# Patient Record
Sex: Female | Born: 1970 | Race: Black or African American | Hispanic: No | Marital: Married | Smoking: Never smoker
Health system: Southern US, Community
[De-identification: ages and names within clinical notes are randomized; demographics above are authoritative.]

## PROBLEM LIST (undated history)

## (undated) DIAGNOSIS — D219 Benign neoplasm of connective and other soft tissue, unspecified: Secondary | ICD-10-CM

## (undated) DIAGNOSIS — E039 Hypothyroidism, unspecified: Secondary | ICD-10-CM

## (undated) DIAGNOSIS — C801 Malignant (primary) neoplasm, unspecified: Secondary | ICD-10-CM

## (undated) DIAGNOSIS — T7840XA Allergy, unspecified, initial encounter: Secondary | ICD-10-CM

## (undated) HISTORY — PX: BREAST LUMPECTOMY: SHX2

## (undated) HISTORY — DX: Allergy, unspecified, initial encounter: T78.40XA

## (undated) HISTORY — DX: Malignant (primary) neoplasm, unspecified: C80.1

## (undated) HISTORY — PX: EYE SURGERY: SHX253

---

## 2000-03-13 ENCOUNTER — Other Ambulatory Visit: Admission: RE | Admit: 2000-03-13 | Discharge: 2000-03-13 | Payer: Self-pay | Admitting: Obstetrics and Gynecology

## 2001-04-15 ENCOUNTER — Other Ambulatory Visit: Admission: RE | Admit: 2001-04-15 | Discharge: 2001-04-15 | Payer: Self-pay | Admitting: Obstetrics and Gynecology

## 2002-01-22 ENCOUNTER — Other Ambulatory Visit: Admission: RE | Admit: 2002-01-22 | Discharge: 2002-01-22 | Payer: Self-pay | Admitting: Obstetrics & Gynecology

## 2002-02-02 ENCOUNTER — Ambulatory Visit (HOSPITAL_COMMUNITY): Admission: RE | Admit: 2002-02-02 | Discharge: 2002-02-02 | Payer: Self-pay | Admitting: Obstetrics & Gynecology

## 2002-02-02 ENCOUNTER — Encounter: Payer: Self-pay | Admitting: Obstetrics & Gynecology

## 2005-11-01 ENCOUNTER — Ambulatory Visit (HOSPITAL_COMMUNITY): Admission: RE | Admit: 2005-11-01 | Discharge: 2005-11-01 | Payer: Self-pay

## 2009-11-11 ENCOUNTER — Emergency Department (HOSPITAL_COMMUNITY): Admission: EM | Admit: 2009-11-11 | Discharge: 2009-11-11 | Payer: Self-pay | Admitting: Emergency Medicine

## 2010-05-26 ENCOUNTER — Emergency Department (HOSPITAL_COMMUNITY): Admission: EM | Admit: 2010-05-26 | Discharge: 2010-05-26 | Payer: Self-pay | Admitting: Emergency Medicine

## 2010-12-06 LAB — CBC
HCT: 37 % (ref 36.0–46.0)
Hemoglobin: 12.7 g/dL (ref 12.0–15.0)
MCH: 31.7 pg (ref 26.0–34.0)
MCHC: 34.4 g/dL (ref 30.0–36.0)
MCV: 92.2 fL (ref 78.0–100.0)
Platelets: 158 10*3/uL (ref 150–400)
RBC: 4.01 MIL/uL (ref 3.87–5.11)
RDW: 13.2 % (ref 11.5–15.5)
WBC: 3.9 10*3/uL — ABNORMAL LOW (ref 4.0–10.5)

## 2010-12-06 LAB — COMPREHENSIVE METABOLIC PANEL
ALT: 19 U/L (ref 0–35)
AST: 27 U/L (ref 0–37)
Albumin: 3.7 g/dL (ref 3.5–5.2)
Alkaline Phosphatase: 37 U/L — ABNORMAL LOW (ref 39–117)
BUN: 10 mg/dL (ref 6–23)
CO2: 20 mEq/L (ref 19–32)
Calcium: 8.9 mg/dL (ref 8.4–10.5)
Chloride: 111 mEq/L (ref 96–112)
Creatinine, Ser: 0.87 mg/dL (ref 0.4–1.2)
GFR calc Af Amer: >60 mL/min (ref >60)
GFR calc non Af Amer: >60 mL/min (ref >60)
Glucose, Bld: 114 mg/dL — ABNORMAL HIGH (ref 70–99)
Potassium: 3.4 mEq/L — ABNORMAL LOW (ref 3.5–5.1)
Sodium: 142 mEq/L (ref 135–145)
Total Bilirubin: 0.6 mg/dL (ref 0.3–1.2)
Total Protein: 7.4 g/dL (ref 6.0–8.3)

## 2010-12-06 LAB — POCT CARDIAC MARKERS
CKMB, poc: <1 ng/mL — ABNORMAL LOW (ref 1.0–8.0)
Myoglobin, poc: 50.4 ng/mL (ref 12–200)
Troponin i, poc: <0.05 ng/mL (ref 0.00–0.09)

## 2010-12-06 LAB — DIFFERENTIAL
Lymphocytes Relative: 30 % (ref 12–46)
Lymphs Abs: 1.2 10*3/uL (ref 0.7–4.0)
Monocytes Relative: 8 % (ref 3–12)
Neutro Abs: 2.4 10*3/uL (ref 1.7–7.7)
Neutrophils Relative %: 62 % (ref 43–77)

## 2010-12-06 LAB — LIPASE, BLOOD: Lipase: 28 U/L (ref 11–59)

## 2010-12-13 LAB — COMPREHENSIVE METABOLIC PANEL
AST: 30 U/L (ref 0–37)
Albumin: 3.9 g/dL (ref 3.5–5.2)
Alkaline Phosphatase: 41 U/L (ref 39–117)
BUN: 10 mg/dL (ref 6–23)
Chloride: 105 mEq/L (ref 96–112)
GFR calc Af Amer: >60 mL/min (ref >60)
Potassium: 3.4 mEq/L — ABNORMAL LOW (ref 3.5–5.1)
Total Bilirubin: 0.7 mg/dL (ref 0.3–1.2)
Total Protein: 7.9 g/dL (ref 6.0–8.3)

## 2010-12-13 LAB — DIFFERENTIAL
Basophils Relative: 0 % (ref 0–1)
Eosinophils Relative: 0 % (ref 0–5)
Lymphocytes Relative: 21 % (ref 12–46)
Monocytes Absolute: 0.4 10*3/uL (ref 0.1–1.0)
Monocytes Relative: 6 % (ref 3–12)
Neutro Abs: 5 10*3/uL (ref 1.7–7.7)

## 2010-12-13 LAB — URINALYSIS, ROUTINE W REFLEX MICROSCOPIC
Bilirubin Urine: NEGATIVE
Glucose, UA: NEGATIVE mg/dL
Specific Gravity, Urine: 1.017 (ref 1.005–1.030)

## 2010-12-13 LAB — CBC
HCT: 39.7 % (ref 36.0–46.0)
Platelets: 200 10*3/uL (ref 150–400)
RDW: 12.8 % (ref 11.5–15.5)
WBC: 6.8 10*3/uL (ref 4.0–10.5)

## 2010-12-13 LAB — URINE MICROSCOPIC-ADD ON

## 2010-12-13 LAB — POCT PREGNANCY, URINE: Preg Test, Ur: NEGATIVE

## 2011-07-14 ENCOUNTER — Emergency Department (HOSPITAL_COMMUNITY)
Admission: EM | Admit: 2011-07-14 | Discharge: 2011-07-14 | Disposition: A | Discharge status: Home / Self Care | Payer: BC Managed Care – PPO | Attending: Emergency Medicine | Admitting: Emergency Medicine

## 2011-07-14 ENCOUNTER — Emergency Department (HOSPITAL_COMMUNITY): Payer: BC Managed Care – PPO

## 2011-07-14 DIAGNOSIS — I10 Essential (primary) hypertension: Secondary | ICD-10-CM | POA: Insufficient documentation

## 2011-07-14 DIAGNOSIS — R1011 Right upper quadrant pain: Secondary | ICD-10-CM | POA: Insufficient documentation

## 2011-07-14 DIAGNOSIS — K8020 Calculus of gallbladder without cholecystitis without obstruction: Secondary | ICD-10-CM | POA: Insufficient documentation

## 2011-07-14 LAB — COMPREHENSIVE METABOLIC PANEL
ALT: 16 U/L (ref 0–35)
AST: 18 U/L (ref 0–37)
Albumin: 3.7 g/dL (ref 3.5–5.2)
Chloride: 102 mEq/L (ref 96–112)
Creatinine, Ser: 0.73 mg/dL (ref 0.50–1.10)
Potassium: 3.3 mEq/L — ABNORMAL LOW (ref 3.5–5.1)
Sodium: 138 mEq/L (ref 135–145)
Total Bilirubin: 0.1 mg/dL — ABNORMAL LOW (ref 0.3–1.2)

## 2011-07-14 LAB — CBC
MCV: 87.6 fL (ref 78.0–100.0)
Platelets: 190 10*3/uL (ref 150–400)
RDW: 12.7 % (ref 11.5–15.5)
WBC: 6.2 10*3/uL (ref 4.0–10.5)

## 2011-07-14 LAB — DIFFERENTIAL
Basophils Absolute: 0 10*3/uL (ref 0.0–0.1)
Basophils Relative: 0 % (ref 0–1)
Eosinophils Absolute: 0 10*3/uL (ref 0.0–0.7)
Eosinophils Relative: 0 % (ref 0–5)
Neutrophils Relative %: 56 % (ref 43–77)

## 2011-11-24 ENCOUNTER — Telehealth: Payer: Self-pay

## 2011-11-24 NOTE — Telephone Encounter (Signed)
Erroneous phone message. Wrong pt.

## 2011-11-24 NOTE — Telephone Encounter (Signed)
Dr Hal Hope   Pt was under the impression she was to receive something for her knee. Please call and Sherilyn Cooter will come by and pick up the information.

## 2011-11-24 NOTE — Telephone Encounter (Signed)
DR Hal Hope WERE YOU GOING TO GIVE SOMETHING FOR PT KNEE PAIN?

## 2012-10-06 ENCOUNTER — Other Ambulatory Visit: Payer: Self-pay | Admitting: Obstetrics

## 2012-10-06 DIAGNOSIS — Z1231 Encounter for screening mammogram for malignant neoplasm of breast: Secondary | ICD-10-CM

## 2012-10-07 ENCOUNTER — Other Ambulatory Visit: Payer: Self-pay | Admitting: Obstetrics

## 2012-10-07 DIAGNOSIS — D259 Leiomyoma of uterus, unspecified: Secondary | ICD-10-CM

## 2012-10-13 ENCOUNTER — Other Ambulatory Visit: Payer: Self-pay | Admitting: Obstetrics

## 2012-10-13 DIAGNOSIS — D259 Leiomyoma of uterus, unspecified: Secondary | ICD-10-CM

## 2012-10-14 ENCOUNTER — Ambulatory Visit
Admission: RE | Admit: 2012-10-14 | Discharge: 2012-10-14 | Disposition: A | Discharge status: Home / Self Care | Payer: BC Managed Care – PPO | Source: Ambulatory Visit | Attending: Obstetrics | Admitting: Obstetrics

## 2012-10-14 ENCOUNTER — Other Ambulatory Visit: Payer: Self-pay | Admitting: Interventional Radiology

## 2012-10-14 DIAGNOSIS — D259 Leiomyoma of uterus, unspecified: Secondary | ICD-10-CM

## 2012-10-14 HISTORY — DX: Benign neoplasm of connective and other soft tissue, unspecified: D21.9

## 2012-10-14 HISTORY — DX: Hypothyroidism, unspecified: E03.9

## 2012-10-14 NOTE — Progress Notes (Signed)
LMP:  09/28/2012.  Frequency:  Q 28 day.  Length:  5 days, days 1-3 heavy flow w/ clots.  Changes pads & tampons Q 2 hrs.  Occasional spotting.  Dysmenorrhea.    Bulk Sx:  Bloating; urinary urgency; abdominal & pelvic pressure; rarely, constipation.

## 2012-10-16 ENCOUNTER — Ambulatory Visit (HOSPITAL_COMMUNITY)
Admission: RE | Admit: 2012-10-16 | Discharge: 2012-10-16 | Disposition: A | Discharge status: Home / Self Care | Payer: BC Managed Care – PPO | Source: Ambulatory Visit | Attending: Obstetrics | Admitting: Obstetrics

## 2012-10-16 ENCOUNTER — Ambulatory Visit (HOSPITAL_COMMUNITY): Payer: BC Managed Care – PPO

## 2012-10-16 DIAGNOSIS — Z1231 Encounter for screening mammogram for malignant neoplasm of breast: Secondary | ICD-10-CM | POA: Insufficient documentation

## 2012-10-19 NOTE — Consult Note (Signed)
Interventional Radiology New Patient Consultation  Date: 10/14/12 CC: Symptomatic uterine fibroids Referring Physician: Leonette Most A. Clearance Coots, MD  History of the Present Illness:   Lauren Perkins is a 42 year old G1 P1 African American female with a long history of symptomatic uterine fibroids. She was initially diagnosed in 2003. Her dominant symptom is menorrhagia. She reports very heavy bleeding during her cycles often with passage of large clots. The first few days of her cycle she often wears both super absorbent pads and tampons which she changes every two hours. Her cycles tend to last approximately five days in length the first  three days being very heavy. She describes very rare interperiod bleeding. Her periods are fairly regular occurring every 28 days. Additionally, she endorses some dysmenorrhea symptoms. She feels that her cramping is often quite severe and more so than her friends and family. She experiences some relief with use of over- the-counter ibuprofen. Additional symptoms include mild bulk symptoms of bloating, abdominal and pelvic pressure and rarely urinary urgency and constipation.   She has used the NuvaRing in the past. She has not tried over-the- counter prophylactics, or other hormonal therapies. She denies any prior surgical intervention for her uterine fibroids. She denies symptoms of anemia. No significant fatigue, palpitations or light headedness. Her most recent hemoglobin was within normal limits at 13.5 although this is from October 2012. Her most recent imaging is an ultrasound from February of 2007 which documented multiple uterine fibroids. Importantly, while [name] asserts that she likely will not have any further children she is not 100 percent certain that she is family complete.   Past Medical History:  Hypertension, not currently on any medications.  Hypothyroidism.   Past Surgical History:  Right eye surgery in 1975.   Medications:  Levothyroxine  25 mg once daily.   Allergies: No known drug allergies.   Social History: Currently divorced. She has one child. 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 She denies tobacco, alcohol and recreational drug use.  She occasionally consumes coffee.   Family History: Positive for hypertension and diabetes in her mother.   Review of Systems: Pertinent symptoms discussed in the history above. Otherwise, fourteen point review of systems is negative.   Physical Exam:  Vital Signs: 5'9" tall, 197 pounds.  Blood pressure 148/92.  Heart rate 77 beats per minute.  Respiratory rate 16.  Oxygen saturation 99 percent on room air.  Temperature 98.5.  General: Well nourished, well developed and mildly overweight female in no acute distress.  Cardiac: Regular rate and rhythm. No murmurs, rubs or gallops.  Pulmonary: Lungs are clear to auscultation bilaterally.  Abdomen: Soft, nondistended. No palpable abdominal mass. There is mild tenderness to palpation in the suprapubic region.  Pulses: 2+ bilateral femoral and dorsalis pedis pulses.  Neuro: Alert and oriented x 3, normal affect.   Assessment and Plan:  Lauren Perkins is a 42 year old female with symptomatic uterine fibroids. Her dominant symptoms are menorrhagia and dysmenorrhea with more mild bulk related symptoms. Given her uncertainty over whether she is family complete, I counseled her that myomectomy may be a treatment option if she is truly considering the possibility  of having another child. While many women have successfully become pregnant and had healthy babies following uterine artery embolization, the limited data that we have available suggests that myomectomy may be a better alternative than uterine artery embolization in this setting.   We will proceed with an MRI of the pelvis to  evaluate the size, number and location of her uterine fibroids. After that, I would recommend she return to Dr.  Verdell Carmine office to discuss whether or not she is a suitable candidate for  myomectomy. If she decides that she would like to maintain the option for future children and she is a myomectomy candidate, this may be her best treatment option. If she is not a surgical candidate, or if she decides she is family complete I think she would be an excellent candidate for uterine artery embolization (barring any unexpected findings on her MRI).   SIgned,   Sterling Big, M.D.  Vascular and Interventional Radiologist  [facility]

## 2012-10-20 ENCOUNTER — Other Ambulatory Visit: Payer: BC Managed Care – PPO

## 2012-10-24 ENCOUNTER — Ambulatory Visit
Admission: RE | Admit: 2012-10-24 | Discharge: 2012-10-24 | Disposition: A | Discharge status: Home / Self Care | Payer: BC Managed Care – PPO | Source: Ambulatory Visit | Attending: Obstetrics | Admitting: Obstetrics

## 2012-10-24 DIAGNOSIS — D259 Leiomyoma of uterus, unspecified: Secondary | ICD-10-CM

## 2012-10-24 MED ORDER — GADOBENATE DIMEGLUMINE 529 MG/ML IV SOLN
19.0000 mL | Freq: Once | INTRAVENOUS | Status: AC | PRN
  Administered 2012-10-24: 19 mL via INTRAVENOUS

## 2012-10-27 ENCOUNTER — Telehealth: Payer: Self-pay | Admitting: Emergency Medicine

## 2012-10-27 NOTE — Telephone Encounter (Signed)
CALLED PT TO MAKE HER AWARE THAT DR MCCULLOUGH REVIEWED MRI AND SAID GOOD CANDIDATE FOR [place].  IF SHE IS READY TO MOVE FORWARD W/ PROCEDURE TO CALL ME AND LET ME KNOW AND IF SHE WANTS TO KEEP APPT. FOR NEXT WEEK OR JUST SET UP PROCEDURE.

## 2012-11-05 ENCOUNTER — Other Ambulatory Visit: Payer: BC Managed Care – PPO

## 2012-11-18 ENCOUNTER — Ambulatory Visit
Admission: RE | Admit: 2012-11-18 | Discharge: 2012-11-18 | Disposition: A | Discharge status: Home / Self Care | Payer: BC Managed Care – PPO | Source: Ambulatory Visit | Attending: Interventional Radiology | Admitting: Interventional Radiology

## 2012-11-18 DIAGNOSIS — D259 Leiomyoma of uterus, unspecified: Secondary | ICD-10-CM

## 2012-11-19 ENCOUNTER — Telehealth: Payer: Self-pay | Admitting: Emergency Medicine

## 2012-11-19 NOTE — Telephone Encounter (Signed)
LMOVM FOR PT TO MAKE HER AWARE THAT PROCEDURE DID NOT NEED AUTHO AND TINA AT [facility] -IR WILL CONTACT HER TO SET UP [place].

## 2012-11-23 ENCOUNTER — Other Ambulatory Visit: Payer: Self-pay | Admitting: Interventional Radiology

## 2012-11-23 DIAGNOSIS — D259 Leiomyoma of uterus, unspecified: Secondary | ICD-10-CM

## 2012-12-29 ENCOUNTER — Encounter: Payer: Self-pay | Admitting: Obstetrics

## 2013-02-18 ENCOUNTER — Telehealth (HOSPITAL_COMMUNITY): Payer: Self-pay | Admitting: Radiology

## 2013-02-18 NOTE — Telephone Encounter (Signed)
Left message for patient to call back.  Messages have been left for patient on 11-24-12, 11-26-12, 12-02-12.  On 12-02-12 patient requested for UFE to be scheduled week of 02-08-13 and patient would be checking on out of pocket expenses.  Message left on 01-12-13 to schedule patient.  Patient has not called to schedule procedure.

## 2013-05-25 ENCOUNTER — Encounter: Payer: Self-pay | Admitting: Obstetrics

## 2013-06-01 ENCOUNTER — Encounter: Payer: Self-pay | Admitting: Obstetrics

## 2013-11-17 ENCOUNTER — Encounter: Payer: Self-pay | Admitting: Obstetrics

## 2014-03-17 ENCOUNTER — Other Ambulatory Visit: Payer: Self-pay | Admitting: *Deleted

## 2014-03-17 NOTE — Telephone Encounter (Signed)
Pharmacy has sent a refill request for Nuva Ring.  Refill request will be faxed back to pharmacy as denied per protocol, pt needs appointment.  Pt has not been seen in office since January 2014.  Pt has refill in February 2015 with refills and should have made a AEX appt at that time.

## 2014-03-17 NOTE — Telephone Encounter (Signed)
Pharmacy requesting refill on pt Nuva Ring.  After reviewing chart, pt request is denied per protocol due to need for annual exam.

## 2014-03-22 ENCOUNTER — Other Ambulatory Visit: Payer: Self-pay | Admitting: *Deleted

## 2014-03-22 ENCOUNTER — Telehealth: Payer: Self-pay | Admitting: *Deleted

## 2014-03-22 DIAGNOSIS — Z309 Encounter for contraceptive management, unspecified: Secondary | ICD-10-CM

## 2014-03-22 MED ORDER — ETONOGESTREL-ETHINYL ESTRADIOL 0.12-0.015 MG/24HR VA RING: VAGINAL_RING | VAGINAL | Status: DC

## 2014-03-22 NOTE — Telephone Encounter (Signed)
Patient called for a refill on her Nuva-Ring. Patient states she has already made an appointment for an Annual exam for April 12, 2014.  Patient advised we would send her one refill and that when she came in on the 21st the doctor would refill her prescription. Prescription sent to [facility] on [address].

## 2014-04-12 ENCOUNTER — Ambulatory Visit: Payer: Self-pay | Admitting: Obstetrics

## 2014-04-13 ENCOUNTER — Ambulatory Visit (INDEPENDENT_AMBULATORY_CARE_PROVIDER_SITE_OTHER): Payer: BC Managed Care – PPO | Admitting: Obstetrics

## 2014-04-13 ENCOUNTER — Encounter: Payer: Self-pay | Admitting: Obstetrics

## 2014-04-13 VITALS — BP 140/94 | HR 85 | Temp 98.1°F | Ht 69.0 in | Wt 209.0 lb

## 2014-04-13 DIAGNOSIS — Z01419 Encounter for gynecological examination (general) (routine) without abnormal findings: Secondary | ICD-10-CM

## 2014-04-13 DIAGNOSIS — A6000 Herpesviral infection of urogenital system, unspecified: Secondary | ICD-10-CM

## 2014-04-13 DIAGNOSIS — Z3040 Encounter for surveillance of contraceptives, unspecified: Secondary | ICD-10-CM

## 2014-04-13 MED ORDER — ETONOGESTREL-ETHINYL ESTRADIOL 0.12-0.015 MG/24HR VA RING: VAGINAL_RING | VAGINAL | Status: DC

## 2014-04-13 MED ORDER — VALACYCLOVIR HCL 500 MG PO TABS: 500.0000 mg | ORAL_TABLET | Freq: Two times a day (BID) | ORAL | Status: DC

## 2014-04-13 NOTE — Progress Notes (Signed)
Subjective:     Lauren Perkins is a 43 y.o. female here for a routine exam.  Current complaints: None.    Personal health questionnaire:  Is patient Ashkenazi Jewish, have a family history of breast and/or ovarian cancer: no Is there a family history of uterine cancer diagnosed at age < 29, gastrointestinal cancer, urinary tract cancer, family member who is a Field seismologist syndrome-associated carrier: no Is the patient overweight and hypertensive, family history of diabetes, personal history of gestational diabetes or PCOS: no Is patient over 7, have PCOS,  family history of premature CHD under age 25, diabetes, smoke, have hypertension or peripheral artery disease:  no  The HPI was reviewed and explored in further detail by the provider. Gynecologic History Patient's last menstrual period was 03/17/2014. Contraception: NuvaRing vaginal inserts Last Pap: 2014. Results were: normal Last mammogram: 2014. Results were: normal  Obstetric History OB History  Gravida Para Term Preterm AB SAB TAB Ectopic Multiple Living  1 1 1       1     # Outcome Date GA Lbr Len/2nd Weight Sex Delivery Anes PTL Lv  1 TRM 04/24/92 [redacted]w[redacted]d  8 lb 13 oz (3.997 kg) M SVD None  Y      Past Medical History  Diagnosis Date  . Hypothyroidism   . Fibroids     History reviewed. No pertinent past surgical history.  Current outpatient prescriptions:etonogestrel-ethinyl estradiol (NUVARING) 0.12-0.015 MG/24HR vaginal ring, Insert vaginally and leave in place for 3 consecutive weeks, then remove for 1 week., Disp: 1 each, Rfl: 11;  valACYclovir (VALTREX) 500 MG tablet, Take 1 tablet (500 mg total) by mouth 2 (two) times daily., Disp: 30 tablet, Rfl: prn No Known Allergies  History  Substance Use Topics  . Smoking status: Never Smoker   . Smokeless tobacco: Never Used  . Alcohol Use: No    History reviewed. No pertinent family history.    Review of Systems  Constitutional: negative for fatigue and weight  loss Respiratory: negative for cough and wheezing Cardiovascular: negative for chest pain, fatigue and palpitations Gastrointestinal: negative for abdominal pain and change in bowel habits Musculoskeletal:negative for myalgias Neurological: negative for gait problems and tremors Behavioral/Psych: negative for abusive relationship, depression Endocrine: negative for temperature intolerance   Genitourinary:negative for abnormal menstrual periods, genital lesions, hot flashes, sexual problems and vaginal discharge Integument/breast: negative for breast lump, breast tenderness, nipple discharge and skin lesion(s)    Objective:       BP 140/94  Pulse 85  Temp(Src) 98.1 F (36.7 C)  Ht 5\' 9"  (1.753 m)  Wt 209 lb (94.802 kg)  BMI 30.85 kg/m2  LMP 03/17/2014 General:   alert  Skin:   no rash or abnormalities  Lungs:   clear to auscultation bilaterally  Heart:   regular rate and rhythm, S1, S2 normal, no murmur, click, rub or gallop  Breasts:   normal without suspicious masses, skin or nipple changes or axillary nodes  Abdomen:  normal findings: no organomegaly, soft, non-tender and no hernia  Pelvis:  External genitalia: normal general appearance Urinary system: urethral meatus normal and bladder without fullness, nontender Vaginal: normal without tenderness, induration or masses Cervix: normal appearance Adnexa: normal bimanual exam Uterus: anteverted and non-tender, normal size   Lab Review Urine pregnancy test Labs reviewed yes Radiologic studies reviewed no    Assessment:    Healthy female exam.    Plan:    Education reviewed: calcium supplements, low fat, low cholesterol diet, safe sex/STD prevention,  self breast exams and weight bearing exercise. Contraception: NuvaRing vaginal inserts. Follow up in: 1 year.   Meds ordered this encounter  Medications  . etonogestrel-ethinyl estradiol (NUVARING) 0.12-0.015 MG/24HR vaginal ring    Sig: Insert vaginally and leave in  place for 3 consecutive weeks, then remove for 1 week.    Dispense:  1 each    Refill:  11  . valACYclovir (VALTREX) 500 MG tablet    Sig: Take 1 tablet (500 mg total) by mouth 2 (two) times daily.    Dispense:  30 tablet    Refill:  prn    Take for 3 days prn.   Orders Placed This Encounter  Procedures  . WET PREP BY MOLECULAR PROBE

## 2014-04-14 LAB — WET PREP BY MOLECULAR PROBE
Candida species: NEGATIVE
GARDNERELLA VAGINALIS: NEGATIVE
Trichomonas vaginosis: NEGATIVE

## 2014-04-15 LAB — PAP IG AND HPV HIGH-RISK: HPV DNA High Risk: NOT DETECTED

## 2014-05-13 ENCOUNTER — Telehealth: Payer: Self-pay | Admitting: *Deleted

## 2014-05-13 NOTE — Telephone Encounter (Signed)
Patient called stating she had a question about her pap smear results. Patient notified that her pap smear was satisfactory and negative. Patient notified that HPV was not detected. Patient had gotten confused because it said HPV High Rish. Patient notified that that is the way the test is ordered but if she looks over beside it that it says not detected. Patient voiced understanding.

## 2014-07-25 ENCOUNTER — Encounter: Payer: Self-pay | Admitting: Obstetrics

## 2015-02-07 ENCOUNTER — Telehealth: Payer: Self-pay | Admitting: *Deleted

## 2015-02-07 DIAGNOSIS — B3731 Acute candidiasis of vulva and vagina: Secondary | ICD-10-CM

## 2015-02-07 DIAGNOSIS — B373 Candidiasis of vulva and vagina: Secondary | ICD-10-CM

## 2015-02-07 NOTE — Telephone Encounter (Signed)
Patient contacted the office requesting a prescription for Diflucan.  Attempted to contact the patient and left message for patient to call the office.

## 2015-02-09 MED ORDER — FLUCONAZOLE 150 MG PO TABS: 150.0000 mg | ORAL_TABLET | Freq: Every day | ORAL | Status: DC

## 2015-02-09 NOTE — Telephone Encounter (Signed)
Patient is requesting a RF of her Diflucan 10:08 Patient c/o itching with discharge- request RF of Diflucan. Patient understands if her symptoms do not improve- she should call for appointment.

## 2015-03-22 ENCOUNTER — Other Ambulatory Visit: Payer: Self-pay | Admitting: Obstetrics

## 2015-04-18 ENCOUNTER — Encounter: Payer: Self-pay | Admitting: Obstetrics

## 2015-04-18 ENCOUNTER — Ambulatory Visit (INDEPENDENT_AMBULATORY_CARE_PROVIDER_SITE_OTHER): Payer: BC Managed Care – PPO | Admitting: Obstetrics

## 2015-04-18 VITALS — BP 148/97 | HR 85 | Temp 98.9°F | Ht 68.5 in | Wt 211.6 lb

## 2015-04-18 DIAGNOSIS — A6000 Herpesviral infection of urogenital system, unspecified: Secondary | ICD-10-CM | POA: Diagnosis not present

## 2015-04-18 DIAGNOSIS — Z0000 Encounter for general adult medical examination without abnormal findings: Secondary | ICD-10-CM

## 2015-04-18 DIAGNOSIS — Z124 Encounter for screening for malignant neoplasm of cervix: Secondary | ICD-10-CM | POA: Diagnosis not present

## 2015-04-18 DIAGNOSIS — I10 Essential (primary) hypertension: Secondary | ICD-10-CM | POA: Diagnosis not present

## 2015-04-18 DIAGNOSIS — Z01419 Encounter for gynecological examination (general) (routine) without abnormal findings: Secondary | ICD-10-CM | POA: Diagnosis not present

## 2015-04-18 DIAGNOSIS — Z1239 Encounter for other screening for malignant neoplasm of breast: Secondary | ICD-10-CM

## 2015-04-18 MED ORDER — CARVEDILOL 6.25 MG PO TABS: 6.2500 mg | ORAL_TABLET | Freq: Two times a day (BID) | ORAL | Status: DC

## 2015-04-18 MED ORDER — TRIAMTERENE-HCTZ 50-25 MG PO CAPS: 1.0000 | ORAL_CAPSULE | ORAL | Status: DC

## 2015-04-18 MED ORDER — VALACYCLOVIR HCL 500 MG PO TABS
500.0000 mg | ORAL_TABLET | Freq: Two times a day (BID) | ORAL | Status: DC
Start: 2015-04-18 — End: 2015-10-10

## 2015-04-18 NOTE — Progress Notes (Signed)
Subjective:        Lauren Perkins is a 44 y.o. female here for a routine exam.  Current complaints: none.    Personal health questionnaire:  Is patient Ashkenazi Jewish, have a family history of breast and/or ovarian cancer: no Is there a family history of uterine cancer diagnosed at age < 69, gastrointestinal cancer, urinary tract cancer, family member who is a Field seismologist syndrome-associated carrier: no Is the patient overweight and hypertensive, family history of diabetes, personal history of gestational diabetes, preeclampsia or PCOS: no Is patient over 5, have PCOS,  family history of premature CHD under age 19, diabetes, smoke, have hypertension or peripheral artery disease:  no At any time, has a partner hit, kicked or otherwise hurt or frightened you?: no Over the past 2 weeks, have you felt down, depressed or hopeless?: no Over the past 2 weeks, have you felt little interest or pleasure in doing things?:no   Gynecologic History Patient's last menstrual period was 03/25/2015. Contraception: NuvaRing vaginal inserts Last Pap: 2015. Results were: normal Last mammogram: 2014. Results were: normal  Obstetric History OB History  Gravida Para Term Preterm AB SAB TAB Ectopic Multiple Living  1 1 1       1     # Outcome Date GA Lbr Len/2nd Weight Sex Delivery Anes PTL Lv  1 Term 04/24/92 [redacted]w[redacted]d  8 lb 13 oz (3.997 kg) M Vag-Spont None  Y      Past Medical History  Diagnosis Date  . Hypothyroidism   . Fibroids     History reviewed. No pertinent past surgical history.   Current outpatient prescriptions:  .  levothyroxine (SYNTHROID, LEVOTHROID) 25 MCG tablet, Take 25 mcg by mouth daily before breakfast., Disp: , Rfl:  .  NUVARING 0.12-0.015 MG/24HR vaginal ring, insert vaginally AND LEAVE IN PLACE FOR 3 CONSECUTIVE WEEKS,THEN REMOVE FOR 1 WEEK, Disp: 1 each, Rfl: 11 .  valACYclovir (VALTREX) 500 MG tablet, Take 1 tablet (500 mg total) by mouth 2 (two) times daily., Disp: 30  tablet, Rfl: prn .  carvedilol (COREG) 6.25 MG tablet, Take 1 tablet (6.25 mg total) by mouth 2 (two) times daily with a meal., Disp: 60 tablet, Rfl: 11 .  fluconazole (DIFLUCAN) 150 MG tablet, Take 1 tablet (150 mg total) by mouth daily. (Patient not taking: Reported on 04/18/2015), Disp: 1 tablet, Rfl: 0 .  triamterene-hydrochlorothiazide (DYAZIDE) 50-25 MG per capsule, Take 1 capsule by mouth every morning., Disp: 30 capsule, Rfl: 11 No Known Allergies  History  Substance Use Topics  . Smoking status: Never Smoker   . Smokeless tobacco: Never Used  . Alcohol Use: No    History reviewed. No pertinent family history.    Review of Systems  Constitutional: negative for fatigue and weight loss Respiratory: negative for cough and wheezing Cardiovascular: negative for chest pain, fatigue and palpitations Gastrointestinal: negative for abdominal pain and change in bowel habits Musculoskeletal:negative for myalgias Neurological: negative for gait problems and tremors Behavioral/Psych: negative for abusive relationship, depression Endocrine: negative for temperature intolerance   Genitourinary:negative for abnormal menstrual periods, genital lesions, hot flashes, sexual problems and vaginal discharge Integument/breast: negative for breast lump, breast tenderness, nipple discharge and skin lesion(s)    Objective:       BP 148/97 mmHg  Pulse 85  Temp(Src) 98.9 F (37.2 C)  Ht 5' 8.5" (1.74 m)  Wt 211 lb 9.6 oz (95.981 kg)  BMI 31.70 kg/m2  LMP 03/25/2015 General:   alert  Skin:  no rash or abnormalities  Lungs:   clear to auscultation bilaterally  Heart:   regular rate and rhythm, S1, S2 normal, no murmur, click, rub or gallop  Breasts:   normal without suspicious masses, skin or nipple changes or axillary nodes  Abdomen:  normal findings: no organomegaly, soft, non-tender and no hernia  Pelvis:  External genitalia: normal general appearance Urinary system: urethral meatus normal  and bladder without fullness, nontender Vaginal: normal without tenderness, induration or masses Cervix: normal appearance Adnexa: normal bimanual exam Uterus: anteverted and non-tender, normal size   Lab Review Urine pregnancy test Labs reviewed: yes Radiologic studies reviewed:  no  Assessment:    Healthy female exam.    Hypertension   Plan:   Dyazide / Coreg Rx for HTN and referred to Internal Medicine for general health maintenance   Education reviewed: calcium supplements, low fat, low cholesterol diet, safe sex/STD prevention, self breast exams and weight bearing exercise. Contraception: NuvaRing vaginal inserts. Mammogram ordered. Follow up in: 1 year.   Meds ordered this encounter  Medications  . levothyroxine (SYNTHROID, LEVOTHROID) 25 MCG tablet    Sig: Take 25 mcg by mouth daily before breakfast.  . DISCONTD: triamterene-hydrochlorothiazide (DYAZIDE) 50-25 MG per capsule    Sig: Take 1 capsule by mouth every morning.    Dispense:  30 capsule    Refill:  11  . DISCONTD: carvedilol (COREG) 6.25 MG tablet    Sig: Take 1 tablet (6.25 mg total) by mouth 2 (two) times daily with a meal.    Dispense:  60 tablet    Refill:  11  . valACYclovir (VALTREX) 500 MG tablet    Sig: Take 1 tablet (500 mg total) by mouth 2 (two) times daily.    Dispense:  30 tablet    Refill:  prn    Take for 3 days prn.  . carvedilol (COREG) 6.25 MG tablet    Sig: Take 1 tablet (6.25 mg total) by mouth 2 (two) times daily with a meal.    Dispense:  60 tablet    Refill:  11  . triamterene-hydrochlorothiazide (DYAZIDE) 50-25 MG per capsule    Sig: Take 1 capsule by mouth every morning.    Dispense:  30 capsule    Refill:  11   Orders Placed This Encounter  Procedures  . MM Digital Diagnostic Bilat    Standing Status: Future     Number of Occurrences:      Standing Expiration Date: 06/18/2016    Order Specific Question:  Reason for Exam (SYMPTOM  OR DIAGNOSIS REQUIRED)    Answer:   screening    Order Specific Question:  Is the patient pregnant?    Answer:  No    Order Specific Question:  Preferred imaging location?    Answer:  [facility]  . Ambulatory referral to Internal Medicine    Referral Priority:  Routine    Referral Type:  Consultation    Referral Reason:  Specialty Services Required    Requested Specialty:  Internal Medicine    Number of Visits Requested:  1

## 2015-04-18 NOTE — Addendum Note (Signed)
Addended by: Lewie Loron D on: 04/18/2015 11:52 AM   Modules accepted: Orders

## 2015-04-18 NOTE — Patient Instructions (Signed)
Hypertension °Hypertension, commonly called high blood pressure, is when the force of blood pumping through your arteries is too strong. Your arteries are the blood vessels that carry blood from your heart throughout your body. A blood pressure reading consists of a higher number over a lower number, such as 110/72. The higher number (systolic) is the pressure inside your arteries when your heart pumps. The lower number (diastolic) is the pressure inside your arteries when your heart relaxes. Ideally you want your blood pressure below 120/80. °Hypertension forces your heart to work harder to pump blood. Your arteries may become narrow or stiff. Having hypertension puts you at risk for heart disease, stroke, and other problems.  °RISK FACTORS °Some risk factors for high blood pressure are controllable. Others are not.  °Risk factors you cannot control include:  °· Race. You may be at higher risk if you are African American. °· Age. Risk increases with age. °· Gender. Men are at higher risk than women before age 45 years. After age 65, women are at higher risk than men. °Risk factors you can control include: °· Not getting enough exercise or physical activity. °· Being overweight. °· Getting too much fat, sugar, calories, or salt in your diet. °· Drinking too much alcohol. °SIGNS AND SYMPTOMS °Hypertension does not usually cause signs or symptoms. Extremely high blood pressure (hypertensive crisis) may cause headache, anxiety, shortness of breath, and nosebleed. °DIAGNOSIS  °To check if you have hypertension, your health care provider will measure your blood pressure while you are seated, with your arm held at the level of your heart. It should be measured at least twice using the same arm. Certain conditions can cause a difference in blood pressure between your right and left arms. A blood pressure reading that is higher than normal on one occasion does not mean that you need treatment. If one blood pressure reading  is high, ask your health care provider about having it checked again. °TREATMENT  °Treating high blood pressure includes making lifestyle changes and possibly taking medicine. Living a healthy lifestyle can help lower high blood pressure. You may need to change some of your habits. °Lifestyle changes may include: °· Following the DASH diet. This diet is high in fruits, vegetables, and whole grains. It is low in salt, red meat, and added sugars. °· Getting at least 2½ hours of brisk physical activity every week. °· Losing weight if necessary. °· Not smoking. °· Limiting alcoholic beverages. °· Learning ways to reduce stress. ° If lifestyle changes are not enough to get your blood pressure under control, your health care provider may prescribe medicine. You may need to take more than one. Work closely with your health care provider to understand the risks and benefits. °HOME CARE INSTRUCTIONS °· Have your blood pressure rechecked as directed by your health care provider.   °· Take medicines only as directed by your health care provider. Follow the directions carefully. Blood pressure medicines must be taken as prescribed. The medicine does not work as well when you skip doses. Skipping doses also puts you at risk for problems.   °· Do not smoke.   °· Monitor your blood pressure at home as directed by your health care provider.  °SEEK MEDICAL CARE IF:  °· You think you are having a reaction to medicines taken. °· You have recurrent headaches or feel dizzy. °· You have swelling in your ankles. °· You have trouble with your vision. °SEEK IMMEDIATE MEDICAL CARE IF: °· You develop a severe headache or confusion. °·   You have unusual weakness, numbness, or feel faint. °· You have severe chest or abdominal pain. °· You vomit repeatedly. °· You have trouble breathing. °MAKE SURE YOU:  °· Understand these instructions. °· Will watch your condition. °· Will get help right away if you are not doing well or get worse. °Document  Released: 09/09/2005 Document Revised: 01/24/2014 Document Reviewed: 07/02/2013 °[employer]® Patient Information ©2015 [employer]. This information is not intended to replace advice given to you by your health care provider. Make sure you discuss any questions you have with your health care provider. ° °Managing Your High Blood Pressure °Blood pressure is a measurement of how forceful your blood is pressing against the walls of the arteries. Arteries are muscular tubes within the circulatory system. Blood pressure does not stay the same. Blood pressure rises when you are active, excited, or nervous; and it lowers during sleep and relaxation. If the numbers measuring your blood pressure stay above normal most of the time, you are at risk for health problems. High blood pressure (hypertension) is a long-term (chronic) condition in which blood pressure is elevated. °A blood pressure reading is recorded as two numbers, such as 120 over 80 (or 120/80). The first, higher number is called the systolic pressure. It is a measure of the pressure in your arteries as the heart beats. The second, lower number is called the diastolic pressure. It is a measure of the pressure in your arteries as the heart relaxes between beats.  °Keeping your blood pressure in a normal range is important to your overall health and prevention of health problems, such as heart disease and stroke. When your blood pressure is uncontrolled, your heart has to work harder than normal. High blood pressure is a very common condition in adults because blood pressure tends to rise with age. Men and women are equally likely to have hypertension but at different times in life. Before age 45, men are more likely to have hypertension. After 46 years of age, women are more likely to have it. Hypertension is especially common in African Americans. This condition often has no signs or symptoms. The cause of the condition is usually not known. Your caregiver can  help you come up with a plan to keep your blood pressure in a normal, healthy range. °BLOOD PRESSURE STAGES °Blood pressure is classified into four stages: normal, prehypertension, stage 1, and stage 2. Your blood pressure reading will be used to determine what type of treatment, if any, is necessary. Appropriate treatment options are tied to these four stages:  °Normal °· Systolic pressure (mm Hg): below 120. °· Diastolic pressure (mm Hg): below 80. °Prehypertension °· Systolic pressure (mm Hg): 120 to 139. °· Diastolic pressure (mm Hg): 80 to 89. °Stage 1 °· Systolic pressure (mm Hg): 140 to 159. °· Diastolic pressure (mm Hg): 90 to 99. °Stage 2 °· Systolic pressure (mm Hg): 160 or above. °· Diastolic pressure (mm Hg): 100 or above. °RISKS RELATED TO HIGH BLOOD PRESSURE °Managing your blood pressure is an important responsibility. Uncontrolled high blood pressure can lead to: °· A heart attack. °· A stroke. °· A weakened blood vessel (aneurysm). °· Heart failure. °· Kidney damage. °· Eye damage. °· Metabolic syndrome. °· Memory and concentration problems. °HOW TO MANAGE YOUR BLOOD PRESSURE °Blood pressure can be managed effectively with lifestyle changes and medicines (if needed). Your caregiver will help you come up with a plan to bring your blood pressure within a normal range. Your plan should include the following: °Education °·   Read all information provided by your caregivers about how to control blood pressure. °· Educate yourself on the latest guidelines and treatment recommendations. New research is always being done to further define the risks and treatments for high blood pressure. °Lifestyle changes °· Control your weight. °· Avoid smoking. °· Stay physically active. °· Reduce the amount of salt in your diet. °· Reduce stress. °· Control any chronic conditions, such as high cholesterol or diabetes. °· Reduce your alcohol intake. °Medicines °· Several medicines (antihypertensive medicines) are available,  if needed, to bring blood pressure within a normal range. °Communication °· Review all the medicines you take with your caregiver because there may be side effects or interactions. °· Talk with your caregiver about your diet, exercise habits, and other lifestyle factors that may be contributing to high blood pressure. °· See your caregiver regularly. Your caregiver can help you create and adjust your plan for managing high blood pressure. °RECOMMENDATIONS FOR TREATMENT AND FOLLOW-UP  °The following recommendations are based on current guidelines for managing high blood pressure in nonpregnant adults. Use these recommendations to identify the proper follow-up period or treatment option based on your blood pressure reading. You can discuss these options with your caregiver. °· Systolic pressure of 120 to 139 or diastolic pressure of 80 to 89: Follow up with your caregiver as directed. °· Systolic pressure of 140 to 160 or diastolic pressure of 90 to 100: Follow up with your caregiver within 2 months. °· Systolic pressure above 160 or diastolic pressure above 100: Follow up with your caregiver within 1 month. °· Systolic pressure above 180 or diastolic pressure above 110: Consider antihypertensive therapy; follow up with your caregiver within 1 week. °· Systolic pressure above 200 or diastolic pressure above 120: Begin antihypertensive therapy; follow up with your caregiver within 1 week. °Document Released: 06/03/2012 Document Reviewed: 06/03/2012 °ExitCare® Patient Information ©2015 [employer]. This information is not intended to replace advice given to you by your health care provider. Make sure you discuss any questions you have with your health care provider. ° °

## 2015-04-20 LAB — PAP IG AND HPV HIGH-RISK: HPV DNA High Risk: NOT DETECTED

## 2015-04-21 LAB — SURESWAB, VAGINOSIS/VAGINITIS PLUS
ATOPOBIUM VAGINAE: NOT DETECTED Log (cells/mL)
C. albicans, DNA: NOT DETECTED
C. glabrata, DNA: NOT DETECTED
C. parapsilosis, DNA: NOT DETECTED
C. trachomatis RNA, TMA: NOT DETECTED
C. tropicalis, DNA: NOT DETECTED
Gardnerella vaginalis: NOT DETECTED Log (cells/mL)
LACTOBACILLUS SPECIES: 8 Log (cells/mL)
MEGASPHAERA SPECIES: NOT DETECTED Log (cells/mL)
N. gonorrhoeae RNA, TMA: NOT DETECTED
T. VAGINALIS RNA, QL TMA: NOT DETECTED

## 2015-04-28 ENCOUNTER — Other Ambulatory Visit: Payer: Self-pay | Admitting: Obstetrics

## 2015-04-28 ENCOUNTER — Ambulatory Visit (HOSPITAL_COMMUNITY)
Admission: RE | Admit: 2015-04-28 | Discharge: 2015-04-28 | Disposition: A | Discharge status: Home / Self Care | Payer: BC Managed Care – PPO | Source: Ambulatory Visit | Attending: Obstetrics | Admitting: Obstetrics

## 2015-04-28 DIAGNOSIS — Z1231 Encounter for screening mammogram for malignant neoplasm of breast: Secondary | ICD-10-CM

## 2015-04-28 DIAGNOSIS — Z1239 Encounter for other screening for malignant neoplasm of breast: Secondary | ICD-10-CM

## 2015-05-01 ENCOUNTER — Other Ambulatory Visit: Payer: Self-pay | Admitting: Obstetrics

## 2015-05-01 DIAGNOSIS — R928 Other abnormal and inconclusive findings on diagnostic imaging of breast: Secondary | ICD-10-CM

## 2015-05-02 ENCOUNTER — Ambulatory Visit
Admission: RE | Admit: 2015-05-02 | Discharge: 2015-05-02 | Disposition: A | Discharge status: Home / Self Care | Payer: BC Managed Care – PPO | Source: Ambulatory Visit | Attending: Obstetrics | Admitting: Obstetrics

## 2015-05-02 ENCOUNTER — Other Ambulatory Visit: Payer: Self-pay | Admitting: Obstetrics

## 2015-05-02 DIAGNOSIS — R928 Other abnormal and inconclusive findings on diagnostic imaging of breast: Secondary | ICD-10-CM

## 2015-05-05 ENCOUNTER — Other Ambulatory Visit: Payer: Self-pay

## 2015-05-05 ENCOUNTER — Other Ambulatory Visit: Payer: Self-pay | Admitting: Obstetrics

## 2015-05-05 DIAGNOSIS — R928 Other abnormal and inconclusive findings on diagnostic imaging of breast: Secondary | ICD-10-CM

## 2015-05-08 ENCOUNTER — Other Ambulatory Visit: Payer: Self-pay | Admitting: Obstetrics

## 2015-05-08 ENCOUNTER — Ambulatory Visit
Admission: RE | Admit: 2015-05-08 | Discharge: 2015-05-08 | Disposition: A | Discharge status: Home / Self Care | Payer: BC Managed Care – PPO | Source: Ambulatory Visit | Attending: Obstetrics | Admitting: Obstetrics

## 2015-05-08 DIAGNOSIS — R928 Other abnormal and inconclusive findings on diagnostic imaging of breast: Secondary | ICD-10-CM

## 2015-05-08 DIAGNOSIS — R921 Mammographic calcification found on diagnostic imaging of breast: Secondary | ICD-10-CM

## 2015-05-09 ENCOUNTER — Other Ambulatory Visit: Payer: Self-pay | Admitting: Obstetrics

## 2015-05-15 ENCOUNTER — Telehealth: Payer: Self-pay | Admitting: *Deleted

## 2015-05-15 NOTE — Telephone Encounter (Signed)
Pt returned call stated she is going to [facility] for 2nd opinion and will return call if she need to come here. Contact information given.

## 2015-05-19 ENCOUNTER — Other Ambulatory Visit: Payer: Self-pay | Admitting: Surgery

## 2015-06-14 ENCOUNTER — Encounter: Payer: Self-pay | Admitting: Internal Medicine

## 2015-06-14 ENCOUNTER — Ambulatory Visit (INDEPENDENT_AMBULATORY_CARE_PROVIDER_SITE_OTHER): Payer: BC Managed Care – PPO | Admitting: Internal Medicine

## 2015-06-14 VITALS — BP 140/88 | HR 97 | Temp 98.7°F | Resp 16 | Ht 69.0 in | Wt 209.0 lb

## 2015-06-14 DIAGNOSIS — Z23 Encounter for immunization: Secondary | ICD-10-CM

## 2015-06-14 DIAGNOSIS — E039 Hypothyroidism, unspecified: Secondary | ICD-10-CM | POA: Diagnosis not present

## 2015-06-14 DIAGNOSIS — I10 Essential (primary) hypertension: Secondary | ICD-10-CM | POA: Diagnosis not present

## 2015-06-14 DIAGNOSIS — D0512 Intraductal carcinoma in situ of left breast: Secondary | ICD-10-CM | POA: Diagnosis not present

## 2015-06-14 DIAGNOSIS — D0510 Intraductal carcinoma in situ of unspecified breast: Secondary | ICD-10-CM | POA: Insufficient documentation

## 2015-06-14 DIAGNOSIS — E669 Obesity, unspecified: Secondary | ICD-10-CM | POA: Diagnosis not present

## 2015-06-14 NOTE — Assessment & Plan Note (Signed)
BP mildly elevated initially but returned to normal with recheck. Will continue hctz for now and getting records with recent blood draw from previous provider.

## 2015-06-14 NOTE — Patient Instructions (Signed)
We have given you the tetanus shot today.   We will not change the medicines today and will see you back in about 3-6 months for the blood pressure check.   Think about losing about 5 pounds before the next visit to help avoid getting diabetes in the future.   Serving Sizes What we call a serving size today is larger than it was in the past. A 1950s fast-food burger contained little more than 1 oz of meat, and a soft drink was 8 oz (1 cup). Today, a "quarter pounder" burger is at least 4 times that amount, and a 32 or 64 oz drink is not uncommon. A possible guide for eating when trying to lose weight is to eat about half as much as you normally do. Some estimates of serving sizes are:  1 Dairy serving:Individual container of yogurt (8 oz) or piece of cheese the size of your thumb (1 oz).  1 Grain serving: 1 slice of bread or  cup pasta.  1 Meat serving: The size of a deck of cards (3 oz).  1 Fruit serving: cup canned fruit or 1 medium fruit.  1 Vegetable serving:  cup of cooked or canned vegetables.  1 Fat serving:The size of 4 stacked dimes. Experts suggest spending 1 or 2 days measuring food portions you commonly eat. This will give you better practice at estimating serving sizes, and will also show whether you are eating an appropriate amount of food to meet your weight goals. If you find that you are eating more than you thought, try measuring your food for a few days so you can "reprogram" yourself to learn what makes a healthy portion for you. SUGGESTIONS FOR CONTROL  In restaurants, share entrees, or ask the waiter to put half the entre in a box or bag before you even touch it.  Order lunch-sized portions. Many restaurants serve 4 to 6 oz of meat at lunch, compared with 8 to 10 oz at dinner.  Split dessert or skip it all together. Have a piece of fruit when you get home.  At home, use smaller plates and bowls. It will look as if you are eating more.  Plate your food in the  kitchen rather than serving it "family style" at the table.  Wait 20 to 30 minutes before taking seconds. This is how long it takes your brain to recognize that you are full.  Check food labels for serving sizes. Eat 1 serving only.  Use measuring cups and spoons to see proper serving sizes.  Buy smaller packages of candy, popcorn, and snacks.  Avoid eating directly out of the bag or carton.  While eating half as much, exercise twice as much. Park further away from the mall, take the stairs instead of the escalator, and walk around your block. Losing weight is a slow, difficult process. It takes long-lasting lifestyle changes. You can make gradual changes over time so they become habits. Look to friends and family to support the healthy changes you are making. Avoid fad diets since they are often only temporary weight loss solutions. Document Released: 06/08/2003 Document Revised: 12/02/2011 Document Reviewed: 12/07/2013 [facility] Patient Information 2015 [place], [place]. This information is not intended to replace advice given to you by your health care provider. Make sure you discuss any questions you have with your health care provider.

## 2015-06-14 NOTE — Progress Notes (Signed)
   Subjective:    Patient ID: Lauren Perkins, female    DOB: [birth year], 44 y.o.   MRN: [id number]  HPI The patient is a 44 YO female coming in for blood pressure and thyroid. She has been seeing another doctor in the past. Thyroid diagnosed about several years and now stable on current dose of medicine. Mild constipation, denies tremors or weight change.  Her blood pressure problems have been diagnosed in the last several years as well. She has been with some high pressures and some that are okay. She is taking her hctz and not the coreg. She was given it by her gynecologist and she decided to wait on taking it until coming here. No headaches, chest pains, SOB, abdominal pain, nausea, vomiting.   Review of Systems  Constitutional: Negative for fever, activity change, appetite change, fatigue and unexpected weight change.  HENT: Negative.   Eyes: Negative.   Respiratory: Negative for cough, chest tightness, shortness of breath and wheezing.   Cardiovascular: Negative for chest pain, palpitations and leg swelling.  Gastrointestinal: Negative for nausea, abdominal pain, diarrhea, constipation and abdominal distention.  Musculoskeletal: Negative.   Skin: Negative.   Neurological: Negative.   Psychiatric/Behavioral: Negative.       Objective:   Physical Exam  Constitutional: She is oriented to person, place, and time. She appears well-developed and well-nourished.  HENT:  Head: Normocephalic and atraumatic.  Eyes: EOM are normal.  Neck: Normal range of motion.  Cardiovascular: Normal rate and regular rhythm.   Pulmonary/Chest: Effort normal and breath sounds normal. No respiratory distress. She has no wheezes.  Abdominal: Soft. Bowel sounds are normal. She exhibits no distension. There is no tenderness.  Musculoskeletal: She exhibits no edema.  Neurological: She is alert and oriented to person, place, and time. Coordination normal.  Skin: Skin is warm and dry.  Psychiatric: She has  a normal mood and affect.   Filed Vitals:   06/14/15 0904  BP: 144/90  Pulse: 97  Temp: 98.7 F (37.1 C)  TempSrc: Oral  Resp: 16  Height: 5\' 9"  (1.753 m)  Weight: 209 lb (94.802 kg)  SpO2: 97%      Assessment & Plan:  Tdap given at visit.

## 2015-06-14 NOTE — Progress Notes (Signed)
Pre visit review using our clinic review tool, if applicable. No additional management support is needed unless otherwise documented below in the visit note. 

## 2015-06-14 NOTE — Assessment & Plan Note (Signed)
Recent diagnosis and there is some question over the pathology. She is going to undergo lumpectomy in the near future and then treatment as appropriate based on pathology.

## 2015-06-14 NOTE — Assessment & Plan Note (Signed)
She states labs recently checked and will get those records. Currently taking 25 mcg daily synthroid. Will adjust as needed after getting labs.

## 2015-06-14 NOTE — Assessment & Plan Note (Signed)
Recently had HgA1c and other labs checked and will get those records. Talked to her about some weight loss to help with her health. Talked with her about serving size.

## 2015-06-22 HISTORY — PX: BREAST LUMPECTOMY: SHX2

## 2015-07-18 ENCOUNTER — Other Ambulatory Visit (INDEPENDENT_AMBULATORY_CARE_PROVIDER_SITE_OTHER): Payer: BC Managed Care – PPO

## 2015-07-18 VITALS — BP 134/93 | HR 70 | Temp 98.1°F | Ht 69.0 in | Wt 210.0 lb

## 2015-07-18 DIAGNOSIS — Z3202 Encounter for pregnancy test, result negative: Secondary | ICD-10-CM | POA: Diagnosis not present

## 2015-07-18 DIAGNOSIS — N926 Irregular menstruation, unspecified: Secondary | ICD-10-CM

## 2015-07-18 LAB — POCT URINE PREGNANCY: PREG TEST UR: NEGATIVE

## 2015-07-18 NOTE — Progress Notes (Signed)
Patient in office today for a pregnancy test. Patient states she is 8 days late on her cycle. Patient states she has recently had surgery. Patient states she has taken a pregnancy test at home which was negative. Pregnancy test in office was negative. Patient encouraged to schedule appointment if she misses another cycle. Patient verbalizes understanding.

## 2015-10-04 ENCOUNTER — Telehealth: Payer: Self-pay | Admitting: *Deleted

## 2015-10-04 MED ORDER — LEVOTHYROXINE SODIUM 25 MCG PO TABS: 25.0000 ug | ORAL_TABLET | Freq: Every day | ORAL | Status: DC

## 2015-10-04 NOTE — Telephone Encounter (Signed)
Patient is calling for a refill of her thyroid medication. She would like it sent to [facility] In looking at her chart- she was referred to a primary for follow up with that.  Call to patient she did see the primary and she is being followed. Told patient she should get her refills from the primary care and if she should have any problems to please let us know. She stated she would contact them for that refill.

## 2015-10-04 NOTE — Telephone Encounter (Signed)
Received call pt states she need refill on her Levothyroxine. Pharmacy has change need sent to [facility] in [place] . Inform pt sending electronically...Johny Chess

## 2015-10-10 ENCOUNTER — Telehealth: Payer: Self-pay | Admitting: *Deleted

## 2015-10-10 DIAGNOSIS — A6000 Herpesviral infection of urogenital system, unspecified: Secondary | ICD-10-CM

## 2015-10-10 MED ORDER — VALACYCLOVIR HCL 500 MG PO TABS: 500.0000 mg | ORAL_TABLET | Freq: Two times a day (BID) | ORAL | Status: DC

## 2015-10-10 NOTE — Telephone Encounter (Signed)
Patient would like a refill of her valtrex sent to [facility] 10:47 Call to patient -got address and told her we would be glad to take care of that for her.

## 2015-11-28 DIAGNOSIS — N6092 Unspecified benign mammary dysplasia of left breast: Secondary | ICD-10-CM | POA: Insufficient documentation

## 2015-12-12 ENCOUNTER — Ambulatory Visit: Payer: BC Managed Care – PPO | Admitting: Internal Medicine

## 2016-01-15 ENCOUNTER — Ambulatory Visit: Payer: BC Managed Care – PPO | Admitting: Internal Medicine

## 2016-02-15 ENCOUNTER — Ambulatory Visit (INDEPENDENT_AMBULATORY_CARE_PROVIDER_SITE_OTHER): Payer: BC Managed Care – PPO | Admitting: Internal Medicine

## 2016-02-15 ENCOUNTER — Other Ambulatory Visit (INDEPENDENT_AMBULATORY_CARE_PROVIDER_SITE_OTHER): Payer: BC Managed Care – PPO

## 2016-02-15 ENCOUNTER — Encounter: Payer: Self-pay | Admitting: Internal Medicine

## 2016-02-15 DIAGNOSIS — I10 Essential (primary) hypertension: Secondary | ICD-10-CM | POA: Diagnosis not present

## 2016-02-15 DIAGNOSIS — E039 Hypothyroidism, unspecified: Secondary | ICD-10-CM

## 2016-02-15 LAB — COMPREHENSIVE METABOLIC PANEL
ALT: 21 U/L (ref 0–35)
AST: 22 U/L (ref 0–37)
Albumin: 4.4 g/dL (ref 3.5–5.2)
Alkaline Phosphatase: 55 U/L (ref 39–117)
BILIRUBIN TOTAL: 0.3 mg/dL (ref 0.2–1.2)
BUN: 12 mg/dL (ref 6–23)
CALCIUM: 9.6 mg/dL (ref 8.4–10.5)
CHLORIDE: 106 meq/L (ref 96–112)
CO2: 27 meq/L (ref 19–32)
CREATININE: 1.02 mg/dL (ref 0.40–1.20)
GFR: 74.81 mL/min (ref >60.00)
GLUCOSE: 89 mg/dL (ref 70–99)
Potassium: 3.9 mEq/L (ref 3.5–5.1)
SODIUM: 139 meq/L (ref 135–145)
Total Protein: 7.5 g/dL (ref 6.0–8.3)

## 2016-02-15 LAB — LIPID PANEL
CHOL/HDL RATIO: 4
Cholesterol: 144 mg/dL (ref 0–200)
HDL: 39.8 mg/dL (ref >39.00)
LDL CALC: 88 mg/dL (ref 0–99)
NONHDL: 104.59
Triglycerides: 81 mg/dL (ref 0.0–149.0)
VLDL: 16.2 mg/dL (ref 0.0–40.0)

## 2016-02-15 LAB — TSH: TSH: 1.66 u[IU]/mL (ref 0.35–4.50)

## 2016-02-15 NOTE — Patient Instructions (Signed)
We are checking the labs today and will call you back with the results.   Continue to work on exercising 3-4 times per week for about 30 minutes per time to help keep the blood pressure down. The other thing to try is to decrease the amount of salt in your diet. Most canned and pre-prepared foods can be high in salt to help preserve them and avoiding those and eating more fresh and steamed items can help.   Carpal Tunnel Syndrome Carpal tunnel syndrome is a condition that causes pain in your hand and arm. The carpal tunnel is a narrow area located on the palm side of your wrist. Repeated wrist motion or certain diseases may cause swelling within the tunnel. This swelling pinches the main nerve in the wrist (median nerve). CAUSES  This condition may be caused by:   Repeated wrist motions.  Wrist injuries.  Arthritis.  A cyst or tumor in the carpal tunnel.  Fluid buildup during pregnancy. Sometimes the cause of this condition is not known.  RISK FACTORS This condition is more likely to develop in:   People who have jobs that cause them to repeatedly move their wrists in the same motion, such as butchers and cashiers.  Women.  People with certain conditions, such as:  Diabetes.  Obesity.  An underactive thyroid (hypothyroidism).  Kidney failure. SYMPTOMS  Symptoms of this condition include:   A tingling feeling in your fingers, especially in your thumb, index, and middle fingers.  Tingling or numbness in your hand.  An aching feeling in your entire arm, especially when your wrist and elbow are bent for long periods of time.  Wrist pain that goes up your arm to your shoulder.  Pain that goes down into your palm or fingers.  A weak feeling in your hands. You may have trouble grabbing and holding items. Your symptoms may feel worse during the night.  DIAGNOSIS  This condition is diagnosed with a medical history and physical exam. You may also have tests, including:   An  electromyogram (EMG). This test measures electrical signals sent by your nerves into the muscles.  X-rays. TREATMENT  Treatment for this condition includes:  Lifestyle changes. It is important to stop doing or modify the activity that caused your condition.  Physical or occupational therapy.  Medicines for pain and inflammation. This may include medicine that is injected into your wrist.  A wrist splint.  Surgery. HOME CARE INSTRUCTIONS  If You Have a Splint:  Wear it as told by your health care provider. Remove it only as told by your health care provider.  Loosen the splint if your fingers become numb and tingle, or if they turn cold and blue.  Keep the splint clean and dry. General Instructions  Take over-the-counter and prescription medicines only as told by your health care provider.  Rest your wrist from any activity that may be causing your pain. If your condition is work related, talk to your employer about changes that can be made, such as getting a wrist pad to use while typing.  If directed, apply ice to the painful area:  Put ice in a plastic bag.  Place a towel between your skin and the bag.  Leave the ice on for 20 minutes, 2-3 times per day.  Keep all follow-up visits as told by your health care provider. This is important.  Do any exercises as told by your health care provider, physical therapist, or occupational therapist. SEEK MEDICAL CARE IF:  You have new symptoms.  Your pain is not controlled with medicines.  Your symptoms get worse.   This information is not intended to replace advice given to you by your health care provider. Make sure you discuss any questions you have with your health care provider.   Document Released: 09/06/2000 Document Revised: 05/31/2015 Document Reviewed: 01/25/2015 Elsevier Interactive Patient Education 2016 [place] DASH stands for "Dietary Approaches to Stop Hypertension." The DASH eating  plan is a healthy eating plan that has been shown to reduce high blood pressure (hypertension). Additional health benefits may include reducing the risk of type 2 diabetes mellitus, heart disease, and stroke. The DASH eating plan may also help with weight loss. WHAT DO I NEED TO KNOW ABOUT THE DASH EATING PLAN? For the DASH eating plan, you will follow these general guidelines:  Choose foods with a percent daily value for sodium of less than 5% (as listed on the food label).  Use salt-free seasonings or herbs instead of table salt or sea salt.  Check with your health care provider or pharmacist before using salt substitutes.  Eat lower-sodium products, often labeled as "lower sodium" or "no salt added."  Eat fresh foods.  Eat more vegetables, fruits, and low-fat dairy products.  Choose whole grains. Look for the word "whole" as the first word in the ingredient list.  Choose fish and skinless chicken or [place] more often than red meat. Limit fish, poultry, and meat to 6 oz (170 g) each day.  Limit sweets, desserts, sugars, and sugary drinks.  Choose heart-healthy fats.  Limit cheese to 1 oz (28 g) per day.  Eat more home-cooked food and less restaurant, buffet, and fast food.  Limit fried foods.  Cook foods using methods other than frying.  Limit canned vegetables. If you do use them, rinse them well to decrease the sodium.  When eating at a restaurant, ask that your food be prepared with less salt, or no salt if possible. WHAT FOODS CAN I EAT? Seek help from a dietitian for individual calorie needs. Grains Whole grain or whole wheat bread. Brown rice. Whole grain or whole wheat pasta. Quinoa, bulgur, and whole grain cereals. Low-sodium cereals. Corn or whole wheat flour tortillas. Whole grain cornbread. Whole grain crackers. Low-sodium crackers. Vegetables Fresh or frozen vegetables (raw, steamed, roasted, or grilled). Low-sodium or reduced-sodium tomato and vegetable juices.  Low-sodium or reduced-sodium tomato sauce and paste. Low-sodium or reduced-sodium canned vegetables.  Fruits All fresh, canned (in natural juice), or frozen fruits. Meat and Other Protein Products Ground beef (85% or leaner), grass-fed beef, or beef trimmed of fat. Skinless chicken or [place]. Ground chicken or [place]. Pork trimmed of fat. All fish and seafood. Eggs. Dried beans, peas, or lentils. Unsalted nuts and seeds. Unsalted canned beans. Dairy Low-fat dairy products, such as skim or 1% milk, 2% or reduced-fat cheeses, low-fat ricotta or cottage cheese, or plain low-fat yogurt. Low-sodium or reduced-sodium cheeses. Fats and Oils Tub margarines without trans fats. Light or reduced-fat mayonnaise and salad dressings (reduced sodium). Avocado. Safflower, olive, or canola oils. Natural peanut or almond butter. Other Unsalted popcorn and pretzels. The items listed above may not be a complete list of recommended foods or beverages. Contact your dietitian for more options. WHAT FOODS ARE NOT RECOMMENDED? Grains White bread. White pasta. White rice. Refined cornbread. Bagels and croissants. Crackers that contain trans fat. Vegetables Creamed or fried vegetables. Vegetables in a cheese sauce. Regular canned vegetables. Regular canned tomato  sauce and paste. Regular tomato and vegetable juices. Fruits Dried fruits. Canned fruit in light or heavy syrup. Fruit juice. Meat and Other Protein Products Fatty cuts of meat. Ribs, chicken wings, bacon, sausage, bologna, salami, chitterlings, fatback, hot dogs, bratwurst, and packaged luncheon meats. Salted nuts and seeds. Canned beans with salt. Dairy Whole or 2% milk, cream, half-and-half, and cream cheese. Whole-fat or sweetened yogurt. Full-fat cheeses or blue cheese. Nondairy creamers and whipped toppings. Processed cheese, cheese spreads, or cheese curds. Condiments Onion and garlic salt, seasoned salt, table salt, and sea salt. Canned and packaged  gravies. Worcestershire sauce. Tartar sauce. Barbecue sauce. Teriyaki sauce. Soy sauce, including reduced sodium. Steak sauce. Fish sauce. Oyster sauce. Cocktail sauce. Horseradish. Ketchup and mustard. Meat flavorings and tenderizers. Bouillon cubes. Hot sauce. Tabasco sauce. Marinades. Taco seasonings. Relishes. Fats and Oils Butter, stick margarine, lard, shortening, ghee, and bacon fat. Coconut, palm kernel, or palm oils. Regular salad dressings. Other Pickles and olives. Salted popcorn and pretzels. The items listed above may not be a complete list of foods and beverages to avoid. Contact your dietitian for more information. WHERE CAN I FIND MORE INFORMATION? National Heart, Lung, and Blood Institute: travelstabloid.com   This information is not intended to replace advice given to you by your health care provider. Make sure you discuss any questions you have with your health care provider.   Document Released: 08/29/2011 Document Revised: 09/30/2014 Document Reviewed: 07/14/2013 Elsevier Interactive Patient Education [employer].

## 2016-02-15 NOTE — Progress Notes (Signed)
Pre visit review using our clinic review tool, if applicable. No additional management support is needed unless otherwise documented below in the visit note. 

## 2016-02-17 NOTE — Progress Notes (Signed)
   Subjective:    Patient ID: Lauren Perkins, female    DOB: [birth year], 45 y.o.   MRN: [id number]  HPI The patient is a 45 YO female coming in for follow up of her thyroid. She is still taking the synthroid but finds it hard to separate from meds and food by 30 minutes. She is not taking her BP medicine as she does not feel that she needs it. She was working on exercise and it came down but lately has not been keeping up with exercise. Also having carpal tunnel and seeing hand doctor for that. They may do a nerve conduction test.   Review of Systems  Constitutional: Negative for fever, activity change, appetite change, fatigue and unexpected weight change.  Respiratory: Negative for cough, chest tightness, shortness of breath and wheezing.   Cardiovascular: Negative for chest pain, palpitations and leg swelling.  Gastrointestinal: Negative for nausea, abdominal pain, diarrhea, constipation and abdominal distention.  Musculoskeletal: Positive for arthralgias.  Neurological: Negative.       Objective:   Physical Exam  Constitutional: She is oriented to person, place, and time. She appears well-developed and well-nourished.  HENT:  Head: Normocephalic and atraumatic.  Eyes: EOM are normal.  Neck: Normal range of motion.  Cardiovascular: Normal rate and regular rhythm.   Pulmonary/Chest: Effort normal and breath sounds normal. No respiratory distress. She has no wheezes.  Abdominal: Soft. Bowel sounds are normal. She exhibits no distension. There is no tenderness.  Musculoskeletal: She exhibits no edema.  Neurological: She is alert and oriented to person, place, and time. Coordination normal.  Skin: Skin is warm and dry.  Psychiatric: She has a normal mood and affect.   Filed Vitals:   02/15/16 1358 02/15/16 1429  BP: 150/90 124/90  Pulse: 92   Temp: 98.6 F (37 C)   TempSrc: Oral   Resp: 12   Height: 5\' 9"  (1.753 m)   Weight: 213 lb (96.616 kg)   SpO2: 98%         Assessment & Plan:

## 2016-02-17 NOTE — Assessment & Plan Note (Signed)
Checking TSH and adjust as needed. Taking synthroid 25 mcg daily.

## 2016-02-17 NOTE — Assessment & Plan Note (Signed)
Stopped taking her medicine and encouraged her to work on losing weight as well as exercise. Given DASH plan. She does not want to resume medication but willing to monitor BP at home. Checking CMP today.

## 2016-04-08 ENCOUNTER — Other Ambulatory Visit: Payer: Self-pay | Admitting: *Deleted

## 2016-04-08 MED ORDER — LEVOTHYROXINE SODIUM 25 MCG PO TABS: 25.0000 ug | ORAL_TABLET | Freq: Every day | ORAL | Status: DC

## 2016-04-18 ENCOUNTER — Ambulatory Visit: Payer: BC Managed Care – PPO | Admitting: Obstetrics

## 2016-06-19 ENCOUNTER — Telehealth: Payer: Self-pay | Admitting: *Deleted

## 2016-06-19 NOTE — Telephone Encounter (Signed)
Pt left msg on triage requesting MD to rx antibiotic "Zpac". Per office policy pt will have to see MD first. Per chart appt made for 9/29 pt will need to keep appt...Lauren Perkins

## 2016-06-21 ENCOUNTER — Ambulatory Visit: Payer: BC Managed Care – PPO | Admitting: Internal Medicine

## 2016-08-14 ENCOUNTER — Encounter: Payer: Self-pay | Admitting: Internal Medicine

## 2016-08-14 ENCOUNTER — Ambulatory Visit (INDEPENDENT_AMBULATORY_CARE_PROVIDER_SITE_OTHER): Payer: BC Managed Care – PPO | Admitting: Internal Medicine

## 2016-08-14 ENCOUNTER — Other Ambulatory Visit (INDEPENDENT_AMBULATORY_CARE_PROVIDER_SITE_OTHER): Payer: BC Managed Care – PPO

## 2016-08-14 VITALS — BP 130/78 | HR 76 | Temp 98.4°F | Resp 12 | Ht 69.0 in | Wt 212.0 lb

## 2016-08-14 DIAGNOSIS — N951 Menopausal and female climacteric states: Secondary | ICD-10-CM | POA: Diagnosis not present

## 2016-08-14 DIAGNOSIS — Z3200 Encounter for pregnancy test, result unknown: Secondary | ICD-10-CM

## 2016-08-14 DIAGNOSIS — E039 Hypothyroidism, unspecified: Secondary | ICD-10-CM

## 2016-08-14 LAB — TSH: TSH: 1.71 u[IU]/mL (ref 0.35–4.50)

## 2016-08-14 LAB — POCT URINE PREGNANCY: PREG TEST UR: NEGATIVE

## 2016-08-14 NOTE — Assessment & Plan Note (Signed)
Checking TSH and adjust her synthroid 25 mcg daily as needed.

## 2016-08-14 NOTE — Progress Notes (Signed)
   Subjective:    Patient ID: Lauren Perkins, female    DOB: 05/27/1971, 45 y.o.   MRN: [id number]  HPI The patient is a 45 YO female coming in for follow up of her thyroid. She is also concerned about pregnancy since her cycle is about 4 weeks late. She is having unprotected sex and stopped using her nuvaring over the summer due to some atypical cells on biopsy after mammogram. She is having some mild hot flashes as well. Denies nausea or vomiting. Taking thyroid medicine daily and no problems with hot or cold intolerance or constipation.   Review of Systems  Constitutional: Negative.   Respiratory: Negative for cough, chest tightness and shortness of breath.   Cardiovascular: Negative for chest pain, palpitations and leg swelling.  Gastrointestinal: Negative for abdominal distention, abdominal pain, constipation, diarrhea, nausea and vomiting.  Genitourinary:       Missed period  Musculoskeletal: Negative.   Skin: Negative.   Neurological: Negative.   Psychiatric/Behavioral: Negative.       Objective:   Physical Exam  Constitutional: She is oriented to person, place, and time. She appears well-developed and well-nourished.  HENT:  Head: Normocephalic and atraumatic.  Eyes: EOM are normal.  Neck: Normal range of motion.  Cardiovascular: Normal rate and regular rhythm.   Pulmonary/Chest: Effort normal and breath sounds normal. No respiratory distress. She has no wheezes. She has no rales.  Abdominal: Soft. She exhibits no distension. There is no tenderness. There is no rebound.  Musculoskeletal: She exhibits no edema.  Neurological: She is alert and oriented to person, place, and time. Coordination normal.  Skin: Skin is warm and dry.   Vitals:   08/14/16 1038  BP: 130/78  Pulse: 76  Resp: 12  Temp: 98.4 F (36.9 C)  TempSrc: Oral  SpO2: 98%  Weight: 212 lb (96.2 kg)  Height: 5\' 9"  (1.753 m)       Assessment & Plan:

## 2016-08-14 NOTE — Progress Notes (Signed)
Pre visit review using our clinic review tool, if applicable. No additional management support is needed unless otherwise documented below in the visit note. 

## 2016-08-14 NOTE — Patient Instructions (Signed)
The pregnancy test is negative so likely the irregular cycles are coming from perimenopause which can go on for several years.   We are checking the labs for the thyroid today and will send the results on mychart.   Perimenopause Perimenopause is the time when your body begins to move into the menopause (no menstrual period for 12 straight months). It is a natural process. Perimenopause can begin 2-8 years before the menopause and usually lasts for 1 year after the menopause. During this time, your ovaries may or may not produce an egg. The ovaries vary in their production of estrogen and progesterone hormones each month. This can cause irregular menstrual periods, difficulty getting pregnant, vaginal bleeding between periods, and uncomfortable symptoms. CAUSES  Irregular production of the ovarian hormones, estrogen and progesterone, and not ovulating every month.  Other causes include:  Tumor of the pituitary gland in the brain.  Medical disease that affects the ovaries.  Radiation treatment.  Chemotherapy.  Unknown causes.  Heavy smoking and excessive alcohol intake can bring on perimenopause sooner. SIGNS AND SYMPTOMS   Hot flashes.  Night sweats.  Irregular menstrual periods.  Decreased sex drive.  Vaginal dryness.  Headaches.  Mood swings.  Depression.  Memory problems.  Irritability.  Tiredness.  Weight gain.  Trouble getting pregnant.  The beginning of losing bone cells (osteoporosis).  The beginning of hardening of the arteries (atherosclerosis). DIAGNOSIS  Your health care provider will make a diagnosis by analyzing your age, menstrual history, and symptoms. He or she will do a physical exam and note any changes in your body, especially your female organs. Female hormone tests may or may not be helpful depending on the amount of female hormones you produce and when you produce them. However, other hormone tests may be helpful to rule out other  problems. TREATMENT  In some cases, no treatment is needed. The decision on whether treatment is necessary during the perimenopause should be made by you and your health care provider based on how the symptoms are affecting you and your lifestyle. Various treatments are available, such as:  Treating individual symptoms with a specific medicine for that symptom.  Herbal medicines that can help specific symptoms.  Counseling.  Group therapy. HOME CARE INSTRUCTIONS   Keep track of your menstrual periods (when they occur, how heavy they are, how long between periods, and how long they last) as well as your symptoms and when they started.  Only take over-the-counter or prescription medicines as directed by your health care provider.  Sleep and rest.  Exercise.  Eat a diet that contains calcium (good for your bones) and soy (acts like the estrogen hormone).  Do not smoke.  Avoid alcoholic beverages.  Take vitamin supplements as recommended by your health care provider. Taking vitamin E may help in certain cases.  Take calcium and vitamin D supplements to help prevent bone loss.  Group therapy is sometimes helpful.  Acupuncture may help in some cases. SEEK MEDICAL CARE IF:   You have questions about any symptoms you are having.  You need a referral to a specialist (gynecologist, psychiatrist, or psychologist). SEEK IMMEDIATE MEDICAL CARE IF:   You have vaginal bleeding.  Your period lasts longer than 8 days.  Your periods are recurring sooner than 21 days.  You have bleeding after intercourse.  You have severe depression.  You have pain when you urinate.  You have severe headaches.  You have vision problems. This information is not intended to replace advice  given to you by your health care provider. Make sure you discuss any questions you have with your health care provider. Document Released: 10/17/2004 Document Revised: 09/30/2014 Document Reviewed:  04/08/2013 Elsevier Interactive Patient Education  2017 [employer].

## 2016-08-14 NOTE — Assessment & Plan Note (Signed)
Pregnancy test negative and she is likely in perimenopause. Reminded her of the need to use condoms to avoid pregnancy while she is not on birth control. She will not be able to predict ovulation since she is not ovulating regularly anymore.

## 2017-02-03 ENCOUNTER — Other Ambulatory Visit: Payer: Self-pay | Admitting: *Deleted

## 2017-02-03 MED ORDER — LEVOTHYROXINE SODIUM 25 MCG PO TABS: 25.0000 ug | ORAL_TABLET | Freq: Every day | ORAL | 0 refills | Status: DC

## 2017-02-03 NOTE — Telephone Encounter (Signed)
Pt left msg on triage needing refill on her levothyroxine. Called pt bck to verify which pharmacy no answer LMOM RTC...Lauren Perkins

## 2017-02-03 NOTE — Telephone Encounter (Signed)
[facility] [address] in [place]

## 2017-02-03 NOTE — Telephone Encounter (Signed)
Sent rx to [facility] in [place]...Lauren Perkins

## 2017-03-10 ENCOUNTER — Other Ambulatory Visit: Payer: Self-pay | Admitting: *Deleted

## 2017-03-10 MED ORDER — LEVOTHYROXINE SODIUM 25 MCG PO TABS: 25.0000 ug | ORAL_TABLET | Freq: Every day | ORAL | 0 refills | Status: DC

## 2017-03-10 NOTE — Telephone Encounter (Signed)
Left msg on triage stating she is needing refill on her Levothyroxine. She has made appt but its not until 6/25, and she is completely out of meds. Called pt back inform can send 30 day until she come in for appt. Rx sent to [facility]...Johny Chess

## 2017-03-10 NOTE — Telephone Encounter (Signed)
Pt actually uses the [facility] in [place]. Cancel script for [facility] and send rx to [facility].Updated pt pharmacy list!.../lmb

## 2017-03-10 NOTE — Addendum Note (Signed)
Addended by: Earnstine Regal on: 03/10/2017 11:28 AM   Modules accepted: Orders

## 2017-03-14 ENCOUNTER — Ambulatory Visit: Payer: BC Managed Care – PPO | Admitting: Nurse Practitioner

## 2017-03-17 ENCOUNTER — Other Ambulatory Visit (INDEPENDENT_AMBULATORY_CARE_PROVIDER_SITE_OTHER): Payer: BC Managed Care – PPO

## 2017-03-17 ENCOUNTER — Ambulatory Visit (INDEPENDENT_AMBULATORY_CARE_PROVIDER_SITE_OTHER): Payer: BC Managed Care – PPO | Admitting: Nurse Practitioner

## 2017-03-17 ENCOUNTER — Encounter: Payer: Self-pay | Admitting: Nurse Practitioner

## 2017-03-17 VITALS — BP 138/86 | HR 70 | Temp 98.5°F | Ht 69.0 in | Wt 220.0 lb

## 2017-03-17 DIAGNOSIS — E039 Hypothyroidism, unspecified: Secondary | ICD-10-CM

## 2017-03-17 LAB — TSH: TSH: 2.55 u[IU]/mL (ref 0.35–4.50)

## 2017-03-17 LAB — T4, FREE: Free T4: 0.49 ng/dL — ABNORMAL LOW (ref 0.60–1.60)

## 2017-03-17 MED ORDER — LEVOTHYROXINE SODIUM 25 MCG PO TABS: 25.0000 ug | ORAL_TABLET | Freq: Every day | ORAL | 1 refills | Status: DC

## 2017-03-17 NOTE — Patient Instructions (Addendum)
Go to basement for blood draw. Will review labs prior to sending med refill.

## 2017-03-17 NOTE — Progress Notes (Signed)
Subjective:  Patient ID: Lauren Perkins, female    DOB: May 13, 1971  Age: 46 y.o. MRN: [id number]  CC: Follow-up (medications refill)   HPI  Hypothyroidism: Stable. Nee medication refill.  Outpatient Medications Prior to Visit  Medication Sig Dispense Refill  . valACYclovir (VALTREX) 500 MG tablet Take 1 tablet (500 mg total) by mouth 2 (two) times daily. 30 tablet prn  . levothyroxine (SYNTHROID, LEVOTHROID) 25 MCG tablet Take 1 tablet (25 mcg total) by mouth daily before breakfast. Must keep 03/17/17 appt for future refills 30 tablet 0   No facility-administered medications prior to visit.     ROS Review of Systems  Constitutional: Negative for malaise/fatigue and weight loss.  Cardiovascular: Negative for palpitations.  Gastrointestinal: Negative for abdominal pain, constipation and diarrhea.  Musculoskeletal: Negative for joint pain and myalgias.  Skin: Negative.   Neurological: Negative for dizziness, sensory change and weakness.  Psychiatric/Behavioral: Negative for depression. The patient is not nervous/anxious and does not have insomnia.      Objective:  BP 138/86   Pulse 70   Temp 98.5 F (36.9 C)   Ht 5\' 9"  (1.753 m)   Wt 220 lb (99.8 kg)   SpO2 100%   BMI 32.49 kg/m   BP Readings from Last 3 Encounters:  03/17/17 138/86  08/14/16 130/78  02/15/16 124/90    Wt Readings from Last 3 Encounters:  03/17/17 220 lb (99.8 kg)  08/14/16 212 lb (96.2 kg)  02/15/16 213 lb (96.6 kg)    Physical Exam  Constitutional: She is oriented to person, place, and time. No distress.  Neck: No thyromegaly present.  Cardiovascular: Normal rate, regular rhythm and normal heart sounds.   Pulmonary/Chest: Effort normal and breath sounds normal.  Musculoskeletal: She exhibits no edema.  Neurological: She is alert and oriented to person, place, and time.  Skin: Skin is warm and dry.  Vitals reviewed.   Lab Results  Component Value Date   WBC 6.2 07/14/2011   HGB  13.5 07/14/2011   HCT 38.8 07/14/2011   PLT 190 07/14/2011   GLUCOSE 89 02/15/2016   CHOL 144 02/15/2016   TRIG 81.0 02/15/2016   HDL 39.80 02/15/2016   LDLCALC 88 02/15/2016   ALT 21 02/15/2016   AST 22 02/15/2016   NA 139 02/15/2016   K 3.9 02/15/2016   CL 106 02/15/2016   CREATININE 1.02 02/15/2016   BUN 12 02/15/2016   CO2 27 02/15/2016   TSH 2.55 03/17/2017    Mm Digital Diagnostic Unilat L  Result Date: 05/08/2015 CLINICAL DATA:  Status post stereotactic biopsy of left breast upper outer quadrant calcifications. EXAM: DIAGNOSTIC LEFT MAMMOGRAM POST STEREOTACTIC BIOPSY COMPARISON:  Previous exam(s). FINDINGS: Mammographic images were obtained following stereotactic guided biopsy of left breast upper outer quadrant calcifications. Two-view mammography demonstrates presence of coil shaped clip in appropriate position. Residual calcifications and mild post biopsy changes are seen. IMPRESSION: Successful post biopsy metallic marker placement, status post left breast stereotactic biopsy. Final Assessment: Post Procedure Mammograms for Marker Placement Electronically Signed   By: Fidela Salisbury M.D.   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 This was found to be concordant by Dr. Fidela Salisbury. Pathology results were discussed with the patient via telephone. The patient reported no problems  with the biopsy site and is doing well. Post biopsy instructions were reviewed and questions were answered. The patient was encouraged to call [facility] with any additional questions and or concerns. The patient was referred to [facility] on May 17, 2015.  Pathology results reported by Terie Purser RN on May 09, 2015. Electronically Signed   By: Fidela Salisbury M.D.   On: 05/09/2015 16:13   Result Date: 05/08/2015 CLINICAL DATA:  Left breast upper outer quadrant calcifications detected on most recent mammography. EXAM: LEFT BREAST STEREOTACTIC CORE NEEDLE BIOPSY COMPARISON:  Previous exams. FINDINGS: The patient and I discussed the procedure of stereotactic-guided biopsy including benefits and alternatives. We discussed the high likelihood of a successful procedure. We discussed the risks of the procedure including infection, bleeding, tissue injury, clip migration, and inadequate sampling. Informed written consent was given. The usual time out protocol was performed immediately prior to the procedure. Using sterile technique and 2% Lidocaine as local anesthetic, under stereotactic guidance, a 9 gauge vacuum assisted device was used to perform core needle biopsy of calcifications in the upper-outer quadrant of the left breast using a superior approach. Specimen radiograph was performed showing presence of calcifications within the specimen. Specimens with calcifications are identified for pathology. At the conclusion of the procedure, a coil shaped tissue marker clip was deployed into the biopsy cavity. Follow-up 2-view mammogram was performed and dictated separately. IMPRESSION: Stereotactic-guided biopsy of left breast calcifications. No apparent complications. Electronically Signed: By: Fidela Salisbury M.D. On: 05/08/2015 13:38    Assessment & Plan:   Lauren Perkins was seen today for follow-up.  Diagnoses and all orders for this visit:  Hypothyroidism, unspecified type -     TSH; Future -     T4, free; Future -     levothyroxine (SYNTHROID, LEVOTHROID) 25 MCG tablet; Take 1 tablet (25 mcg total) by mouth daily before breakfast.   I have changed Lauren Perkins's levothyroxine. I am also having her maintain her valACYclovir.  Meds ordered this  encounter  Medications  . levothyroxine (SYNTHROID, LEVOTHROID) 25 MCG tablet    Sig: Take 1 tablet (25 mcg total) by mouth daily before breakfast.    Dispense:  90 tablet    Refill:  1    Order Specific Question:   Supervising Provider    Answer:   Cassandria Anger [[id number]]    Follow-up: Return in about 6 months (around 09/16/2017) for hypothyroidism with Dr. Sharlet Salina.Wilfred Lacy, NP

## 2017-04-22 ENCOUNTER — Telehealth: Payer: Self-pay

## 2017-04-22 DIAGNOSIS — A6000 Herpesviral infection of urogenital system, unspecified: Secondary | ICD-10-CM

## 2017-04-22 MED ORDER — VALACYCLOVIR HCL 500 MG PO TABS: 500.0000 mg | ORAL_TABLET | Freq: Two times a day (BID) | ORAL | 99 refills | Status: DC

## 2017-04-22 NOTE — Telephone Encounter (Signed)
Pt called wanting a refill on her valtrex. Rx has been sent to pharmacy. Rx sent

## 2017-05-01 ENCOUNTER — Ambulatory Visit: Payer: BC Managed Care – PPO | Admitting: Internal Medicine

## 2017-05-01 ENCOUNTER — Other Ambulatory Visit (INDEPENDENT_AMBULATORY_CARE_PROVIDER_SITE_OTHER): Payer: BC Managed Care – PPO

## 2017-05-01 ENCOUNTER — Encounter: Payer: Self-pay | Admitting: Family Medicine

## 2017-05-01 ENCOUNTER — Ambulatory Visit (INDEPENDENT_AMBULATORY_CARE_PROVIDER_SITE_OTHER): Payer: BC Managed Care – PPO | Admitting: Family Medicine

## 2017-05-01 VITALS — BP 140/92 | HR 78 | Temp 98.6°F | Ht 69.0 in | Wt 218.0 lb

## 2017-05-01 DIAGNOSIS — R55 Syncope and collapse: Secondary | ICD-10-CM

## 2017-05-01 DIAGNOSIS — R42 Dizziness and giddiness: Secondary | ICD-10-CM | POA: Diagnosis not present

## 2017-05-01 LAB — CBC
HEMATOCRIT: 39 % (ref 36.0–46.0)
Hemoglobin: 12.6 g/dL (ref 12.0–15.0)
MCHC: 32.3 g/dL (ref 30.0–36.0)
MCV: 89.4 fl (ref 78.0–100.0)
Platelets: 276 10*3/uL (ref 150.0–400.0)
RBC: 4.36 Mil/uL (ref 3.87–5.11)
RDW: 13.9 % (ref 11.5–15.5)
WBC: 6.4 10*3/uL (ref 4.0–10.5)

## 2017-05-01 LAB — COMPREHENSIVE METABOLIC PANEL
ALBUMIN: 4.4 g/dL (ref 3.5–5.2)
ALK PHOS: 58 U/L (ref 39–117)
ALT: 17 U/L (ref 0–35)
AST: 15 U/L (ref 0–37)
BILIRUBIN TOTAL: 0.3 mg/dL (ref 0.2–1.2)
BUN: 11 mg/dL (ref 6–23)
CALCIUM: 9.5 mg/dL (ref 8.4–10.5)
CHLORIDE: 104 meq/L (ref 96–112)
CO2: 26 mEq/L (ref 19–32)
CREATININE: 0.95 mg/dL (ref 0.40–1.20)
GFR: 80.79 mL/min (ref >60.00)
Glucose, Bld: 89 mg/dL (ref 70–99)
POTASSIUM: 3.9 meq/L (ref 3.5–5.1)
SODIUM: 135 meq/L (ref 135–145)
Total Protein: 7.8 g/dL (ref 6.0–8.3)

## 2017-05-01 NOTE — Progress Notes (Signed)
Lauren Perkins - 46 y.o. female MRN [id number]  Date of birth: [birth year]  SUBJECTIVE:  Including CC & ROS.  Chief Complaint  Patient presents with  . Dizziness    X1 month, Patient states gettin gup or turning head fast causes her to get dizzy and that she will have to grab on to something if she is standing. Sometimes there is a ringing in her ears    Lauren Perkins is a 46 year old female that is presenting with lightheadedness. She feels this when she is getting up from a seated position. She also has felt this while seated and turning her head. She does not feel as though the room is spinning or feeling uncoordinated. She feels like she is about to pass out at times. She has never completely passed out. She denies any new medications. She is unsure if she is pregnant. She denies any recent illnesses. She denies any flying or scuba diving. She denies any prior history of similar symptoms. Drinks water throughout the day. Her symptoms are not associated with any emotional distress, nausea, or any sweating.     Review of Systems  Constitutional: Negative for fever.  Musculoskeletal: Negative for gait problem.  Skin: Negative for rash.  Neurological: Positive for dizziness.  otherwise negative   HISTORY: Past Medical, Surgical, Social, and Family History Reviewed & Updated per EMR.   Pertinent Historical Findings include:  Past Medical History:  Diagnosis Date  . Cancer ([place])   . Fibroids   . Hypothyroidism     Past Surgical History:  Procedure Laterality Date  . BREAST LUMPECTOMY Left [id number]   atipical hyperplasia  . EYE SURGERY     right eye when she was 3    No Known Allergies  Family History  Problem Relation Age of Onset  . Arthritis Mother   . Heart disease Mother   . Hypertension Mother   . Diabetes Mother   . Alcohol abuse Father   . Mental illness Father   . Cancer Paternal Aunt        breast     Social History   Social History  . Marital status:  Legally Separated    Spouse name: N/A  . Number of children: N/A  . Years of education: N/A   Occupational History  . Not on file.   Social History Main Topics  . Smoking status: Never Smoker  . Smokeless tobacco: Never Used  . Alcohol use No  . Drug use: No  . Sexual activity: Yes    Birth control/ protection: Inserts     Comment: NUVA-RING   Other Topics Concern  . Not on file   Social History Narrative  . No narrative on file     PHYSICAL EXAM:  VS: BP (!) 140/92 (BP Location: Left Arm, Patient Position: Sitting, Cuff Size: Large)   Pulse 78   Temp 98.6 F (37 C) (Oral)   Ht 5\' 9"  (1.753 m)   Wt 218 lb (98.9 kg)   SpO2 100%   BMI 32.19 kg/m  Physical Exam  Constitutional: She is oriented to person, place, and time. She appears well-developed and well-nourished.  HENT:  Head: Normocephalic and atraumatic.  Normal tympanic membranes bilaterally  Eyes: Conjunctivae and EOM are normal.  Neck: Normal range of motion. Neck supple.  Cardiovascular: Normal rate, regular rhythm and normal heart sounds.   No murmur heard. Pulmonary/Chest: Effort normal and breath sounds normal.  Musculoskeletal: Normal range of motion. She exhibits no edema.  Neurological: She is alert and oriented to person, place, and time.  Skin: Skin is warm and dry.  Psychiatric: She has a normal mood and affect. Her behavior is normal.    Orthostatic VS for the past 24 hrs (Last 3 readings):  BP- Lying Pulse- Lying BP- Sitting Pulse- Sitting BP- Standing at 0 minutes Pulse- Standing at 0 minutes BP- Standing at 3 minutes Pulse- Standing at 3 minutes  05/01/17 1451 (!) 144/98 76 138/90 74 130/88 74 140/90 82      ASSESSMENT & PLAN:   I spent 25 minutes with this patient, greater than 50% was face-to-face time counseling regarding the below diagnosis.   Postural dizziness with presyncope Unclear if her symptoms are associated with vasovagal versus cardiogenic. Reviewed her blood work  today. Orthostatic vital signs were negative. - CMP and CBC today. - If still occurring to consider referral to cardiology.

## 2017-05-01 NOTE — Patient Instructions (Addendum)
Thank you for coming in,   We will call you with the results from today.    Please feel free to call with any questions or concerns at any time, at [phone]. --Dr. Schmitz  

## 2017-05-01 NOTE — Assessment & Plan Note (Addendum)
Unclear if her symptoms are associated with vasovagal versus cardiogenic. Reviewed her blood work today. Orthostatic vital signs were negative. - CMP and CBC today. - If still occurring to consider referral to cardiology.

## 2017-06-27 ENCOUNTER — Ambulatory Visit (INDEPENDENT_AMBULATORY_CARE_PROVIDER_SITE_OTHER): Payer: BC Managed Care – PPO | Admitting: Internal Medicine

## 2017-06-27 ENCOUNTER — Encounter: Payer: Self-pay | Admitting: Internal Medicine

## 2017-06-27 DIAGNOSIS — R42 Dizziness and giddiness: Secondary | ICD-10-CM

## 2017-06-27 NOTE — Assessment & Plan Note (Signed)
Given epley maneuver to use. If no improvement will refer to PT for vestibular therapy.

## 2017-06-27 NOTE — Progress Notes (Signed)
   Subjective:    Patient ID: Lauren Perkins, female    DOB: [birth year], 46 y.o.   MRN: [id number]  HPI The patient is a 46 YO female coming in for vertigo symptoms. She is having it for about 3 months. Started with 1 flare which lasted several days. This got better and then she started having it back about once per week. Now she is having it daily for the last several week and it is worrying her. Feels like the world is not stable and she might fall. She denies feeling lightheaded or like she may pass out. Denies chest pains, palpitations.   Review of Systems  Constitutional: Negative.   Respiratory: Negative.   Cardiovascular: Negative.   Gastrointestinal: Negative.   Musculoskeletal: Negative.   Neurological: Positive for dizziness. Negative for tremors, syncope, weakness, light-headedness, numbness and headaches.      Objective:   Physical Exam  Constitutional: She appears well-developed and well-nourished.  HENT:  Head: Normocephalic and atraumatic.  Right Ear: External ear normal.  Left Ear: External ear normal.  Mouth/Throat: Oropharynx is clear and moist.  Eyes: EOM are normal.  Neck: Normal range of motion.  Cardiovascular: Normal rate and regular rhythm.   Pulmonary/Chest: Effort normal and breath sounds normal.  Abdominal: Soft.  Skin: Skin is warm and dry.   Vitals:   06/27/17 1546  BP: 122/82  Pulse: 75  Temp: 98.6 F (37 C)  TempSrc: Oral  SpO2: 99%  Weight: 224 lb (101.6 kg)  Height: 5\' 9"  (1.753 m)      Assessment & Plan:

## 2017-10-06 ENCOUNTER — Telehealth: Payer: Self-pay | Admitting: Internal Medicine

## 2017-10-06 DIAGNOSIS — E039 Hypothyroidism, unspecified: Secondary | ICD-10-CM

## 2017-10-06 MED ORDER — LEVOTHYROXINE SODIUM 25 MCG PO TABS: 25.0000 ug | ORAL_TABLET | Freq: Every day | ORAL | 1 refills | Status: DC

## 2017-10-06 NOTE — Telephone Encounter (Signed)
LR: 03/17/17 #90 tabs 1 refill  OV: 03/17/17 TSH:03/17/17 : 2.55

## 2017-10-06 NOTE — Telephone Encounter (Signed)
Copied from [place]. Topic: Quick Communication - Rx Refill/Question >> Oct 06, 2017  8:54 AM Yvette Rack wrote: Medication: levothyroxine (SYNTHROID, LEVOTHROID) 25 MCG tablet        patient has 1 pill left   Has the patient contacted their pharmacy? Yes.     (Agent: If no, request that the patient contact the pharmacy for the refill.)   Preferred Pharmacy (with phone number or street name): [place], [place] - [address] [phone] (Phone) [phone] (Fax)     Agent: Please be advised that RX refills may take up to 3 business days. We ask that you follow-up with your pharmacy.

## 2018-04-14 ENCOUNTER — Telehealth: Payer: Self-pay | Admitting: Internal Medicine

## 2018-04-14 DIAGNOSIS — E039 Hypothyroidism, unspecified: Secondary | ICD-10-CM

## 2018-04-14 MED ORDER — LEVOTHYROXINE SODIUM 25 MCG PO TABS: 25.0000 ug | ORAL_TABLET | Freq: Every day | ORAL | 0 refills | Status: DC

## 2018-04-14 NOTE — Telephone Encounter (Signed)
Copied from [place] [id number]. Topic: Quick Communication - Rx Refill/Question >> Apr 14, 2018 10:42 AM Judyann Munson wrote: Medication: levothyroxine (SYNTHROID, LEVOTHROID) 25 MCG tablet  Has the patient contacted their pharmacy? No  Preferred Pharmacy (with phone number or street name):   Agent: Please be advised that RX refills may take up to 3 business days. We ask that you follow-up with your pharmacy.

## 2018-04-14 NOTE — Telephone Encounter (Signed)
Refill sent patient will need office visit for further refills

## 2018-05-29 ENCOUNTER — Telehealth: Payer: Self-pay

## 2018-05-29 ENCOUNTER — Encounter: Payer: Self-pay | Admitting: Internal Medicine

## 2018-05-29 ENCOUNTER — Other Ambulatory Visit: Payer: Self-pay | Admitting: Internal Medicine

## 2018-05-29 ENCOUNTER — Other Ambulatory Visit (INDEPENDENT_AMBULATORY_CARE_PROVIDER_SITE_OTHER): Payer: BC Managed Care – PPO

## 2018-05-29 ENCOUNTER — Ambulatory Visit (INDEPENDENT_AMBULATORY_CARE_PROVIDER_SITE_OTHER): Payer: BC Managed Care – PPO | Admitting: Internal Medicine

## 2018-05-29 VITALS — BP 128/86 | HR 79 | Temp 98.4°F | Ht 69.0 in | Wt 220.0 lb

## 2018-05-29 DIAGNOSIS — E559 Vitamin D deficiency, unspecified: Secondary | ICD-10-CM

## 2018-05-29 DIAGNOSIS — E039 Hypothyroidism, unspecified: Secondary | ICD-10-CM

## 2018-05-29 DIAGNOSIS — F4321 Adjustment disorder with depressed mood: Secondary | ICD-10-CM

## 2018-05-29 DIAGNOSIS — R3129 Other microscopic hematuria: Secondary | ICD-10-CM

## 2018-05-29 DIAGNOSIS — Z0001 Encounter for general adult medical examination with abnormal findings: Secondary | ICD-10-CM | POA: Insufficient documentation

## 2018-05-29 DIAGNOSIS — Z114 Encounter for screening for human immunodeficiency virus [HIV]: Secondary | ICD-10-CM | POA: Diagnosis not present

## 2018-05-29 DIAGNOSIS — Z0000 Encounter for general adult medical examination without abnormal findings: Secondary | ICD-10-CM | POA: Diagnosis not present

## 2018-05-29 DIAGNOSIS — A6000 Herpesviral infection of urogenital system, unspecified: Secondary | ICD-10-CM

## 2018-05-29 LAB — LIPID PANEL
CHOL/HDL RATIO: 3
Cholesterol: 125 mg/dL (ref 0–200)
HDL: 37.6 mg/dL — ABNORMAL LOW (ref >39.00)
LDL Cholesterol: 74 mg/dL (ref 0–99)
NonHDL: 87.39
Triglycerides: 68 mg/dL (ref 0.0–149.0)
VLDL: 13.6 mg/dL (ref 0.0–40.0)

## 2018-05-29 LAB — CBC WITH DIFFERENTIAL/PLATELET
BASOS ABS: 0 10*3/uL (ref 0.0–0.1)
Basophils Relative: 0.2 % (ref 0.0–3.0)
Eosinophils Absolute: 0.1 10*3/uL (ref 0.0–0.7)
Eosinophils Relative: 1.4 % (ref 0.0–5.0)
HCT: 36.8 % (ref 36.0–46.0)
Hemoglobin: 11.8 g/dL — ABNORMAL LOW (ref 12.0–15.0)
LYMPHS ABS: 1.7 10*3/uL (ref 0.7–4.0)
Lymphocytes Relative: 41.4 % (ref 12.0–46.0)
MCHC: 32.2 g/dL (ref 30.0–36.0)
MCV: 81.6 fl (ref 78.0–100.0)
Monocytes Absolute: 0.4 10*3/uL (ref 0.1–1.0)
Monocytes Relative: 9 % (ref 3.0–12.0)
NEUTROS PCT: 48 % (ref 43.0–77.0)
Neutro Abs: 2 10*3/uL (ref 1.4–7.7)
Platelets: 247 10*3/uL (ref 150.0–400.0)
RBC: 4.5 Mil/uL (ref 3.87–5.11)
RDW: 15.8 % — ABNORMAL HIGH (ref 11.5–15.5)
WBC: 4.1 10*3/uL (ref 4.0–10.5)

## 2018-05-29 LAB — URINALYSIS, ROUTINE W REFLEX MICROSCOPIC
BILIRUBIN URINE: NEGATIVE
KETONES UR: NEGATIVE
LEUKOCYTES UA: NEGATIVE
Nitrite: NEGATIVE
Specific Gravity, Urine: 1.02 (ref 1.000–1.030)
Total Protein, Urine: NEGATIVE
URINE GLUCOSE: NEGATIVE
UROBILINOGEN UA: 0.2 (ref 0.0–1.0)
pH: 6 (ref 5.0–8.0)

## 2018-05-29 LAB — HEPATIC FUNCTION PANEL
ALBUMIN: 4.3 g/dL (ref 3.5–5.2)
ALK PHOS: 53 U/L (ref 39–117)
ALT: 20 U/L (ref 0–35)
AST: 19 U/L (ref 0–37)
BILIRUBIN TOTAL: 0.5 mg/dL (ref 0.2–1.2)
Bilirubin, Direct: 0.1 mg/dL (ref 0.0–0.3)
Total Protein: 7.6 g/dL (ref 6.0–8.3)

## 2018-05-29 LAB — BASIC METABOLIC PANEL
BUN: 12 mg/dL (ref 6–23)
CALCIUM: 9.4 mg/dL (ref 8.4–10.5)
CO2: 25 meq/L (ref 19–32)
CREATININE: 0.92 mg/dL (ref 0.40–1.20)
Chloride: 106 mEq/L (ref 96–112)
GFR: 83.46 mL/min (ref >60.00)
GLUCOSE: 100 mg/dL — AB (ref 70–99)
Potassium: 4 mEq/L (ref 3.5–5.1)
Sodium: 139 mEq/L (ref 135–145)

## 2018-05-29 LAB — TSH: TSH: 3.32 u[IU]/mL (ref 0.35–4.50)

## 2018-05-29 LAB — T4, FREE: Free T4: 0.57 ng/dL — ABNORMAL LOW (ref 0.60–1.60)

## 2018-05-29 LAB — VITAMIN D 25 HYDROXY (VIT D DEFICIENCY, FRACTURES): VITD: 13.1 ng/mL — ABNORMAL LOW (ref 30.00–100.00)

## 2018-05-29 MED ORDER — LEVOTHYROXINE SODIUM 25 MCG PO TABS
25.0000 ug | ORAL_TABLET | Freq: Every day | ORAL | 3 refills | Status: DC
Start: 2018-05-29 — End: 2018-05-29

## 2018-05-29 MED ORDER — VALACYCLOVIR HCL 500 MG PO TABS: 500.0000 mg | ORAL_TABLET | Freq: Two times a day (BID) | ORAL | 99 refills | Status: DC

## 2018-05-29 MED ORDER — VITAMIN D (ERGOCALCIFEROL) 1.25 MG (50000 UNIT) PO CAPS: 50000.0000 [IU] | ORAL_CAPSULE | ORAL | 0 refills | Status: DC

## 2018-05-29 MED ORDER — LEVOTHYROXINE SODIUM 50 MCG PO TABS: 50.0000 ug | ORAL_TABLET | Freq: Every day | ORAL | 3 refills | Status: DC

## 2018-05-29 NOTE — Assessment & Plan Note (Signed)

## 2018-05-29 NOTE — Patient Instructions (Signed)
Please continue all other medications as before, and refills have been done if requested.  Please have the pharmacy call with any other refills you may need.  Please continue your efforts at being more active, low cholesterol diet, and weight control.  You are otherwise up to date with prevention measures today.  Please keep your appointments with your specialists as you may have planned  You will be contacted regarding the referral for: counseling  Please go to the LAB in the Basement (turn left off the elevator) for the tests to be done today  You will be contacted by phone if any changes need to be made immediately.  Otherwise, you will receive a letter about your results with an explanation, but please check with MyChart first.  Please remember to sign up for MyChart if you have not done so, as this will be important to you in the future with finding out test results, communicating by private email, and scheduling acute appointments online when needed.  Please return in 1 year for your yearly visit, or sooner if needed

## 2018-05-29 NOTE — Telephone Encounter (Signed)
-----   Message from Biagio Borg, MD sent at 05/29/2018 12:17 PM EDT ----- Left message on MyChart, pt to cont same tx except  The test results show that your current treatment is OK, except the Vitamin D is quite low.  Please take Vit D at 50,000 units per week for 12 weeks, then take OTC Vitamin D3 at 2000 units per day after that.  Also the Free t4 is slightly low; we can increase the thyroid medication to 50 mcg per day.  I will send the prescriptions, and you should hear from the office as well.  Shirron to please inform pt, I will do rx x 2

## 2018-05-29 NOTE — Assessment & Plan Note (Signed)
Also for free t4 with labs

## 2018-05-29 NOTE — Progress Notes (Signed)
Subjective:    Patient ID: Lauren Perkins, female    DOB: 1971-05-06, 47 y.o.   MRN: [id number]  HPI Here for wellness and f/u; Unable for PCP due to needs exam and scheduling difficulty Overall doing ok;  Pt denies Chest pain, worsening SOB, DOE, wheezing, orthopnea, PND, worsening LE edema, palpitations, dizziness or syncope.  Pt denies neurological change such as new headache, facial or extremity weakness.  Pt denies polydipsia, polyuria, or low sugar symptoms. Pt states overall good compliance with treatment and medications, good tolerability, and has been trying to follow appropriate diet.  Pt denies worsening depressive symptoms, suicidal ideation or panic. No fever, night sweats, wt loss, loss of appetite, or other constitutional symptoms.  Pt states good ability with ADL's, has low fall risk, home safety reviewed and adequate, no other significant changes in hearing or vision, and only occasionally active with exercise. Does c/o ongoing fatigue, but denies signficant daytime hypersomnolence, but has had some wt gain per pt Wt Readings from Last 3 Encounters:  05/29/18 220 lb (99.8 kg)  06/27/17 224 lb (101.6 kg)  05/01/17 218 lb (98.9 kg)  Mother died last yr, still grieving, asks for counseling refreral, denies depression or stress enough for other treatment.  Plans to call for GYN f/u appt soon.   Past Medical History:  Diagnosis Date  . Cancer ([place])   . Fibroids   . Hypothyroidism    Past Surgical History:  Procedure Laterality Date  . BREAST LUMPECTOMY Left [id number]   atipical hyperplasia  . EYE SURGERY     right eye when she was 3    reports that she has never smoked. She has never used smokeless tobacco. She reports that she does not drink alcohol or use drugs. family history includes Alcohol abuse in her father; Arthritis in her mother; Cancer in her paternal aunt; Diabetes in her mother; Heart disease in her mother; Hypertension in her mother; Mental illness in her  father. No Known Allergies No current outpatient medications on file prior to visit.   No current facility-administered medications on file prior to visit.    Review of Systems Constitutional: Negative for other unusual diaphoresis, sweats, appetite or weight changes HENT: Negative for other worsening hearing loss, ear pain, facial swelling, mouth sores or neck stiffness.   Eyes: Negative for other worsening pain, redness or other visual disturbance.  Respiratory: Negative for other stridor or swelling Cardiovascular: Negative for other palpitations or other chest pain  Gastrointestinal: Negative for worsening diarrhea or loose stools, blood in stool, distention or other pain Genitourinary: Negative for hematuria, flank pain or other change in urine volume.  Musculoskeletal: Negative for myalgias or other joint swelling.  Skin: Negative for other color change, or other wound or worsening drainage.  Neurological: Negative for other syncope or numbness. Hematological: Negative for other adenopathy or swelling Psychiatric/Behavioral: Negative for hallucinations, other worsening agitation, SI, self-injury, or new decreased concentration All other system neg per pt    Objective:   Physical Exam BP 128/86   Pulse 79   Temp 98.4 F (36.9 C) (Oral)   Ht 5\' 9"  (1.753 m)   Wt 220 lb (99.8 kg)   SpO2 97%   BMI 32.49 kg/m  VS noted,  Constitutional: Pt is oriented to person, place, and time. Appears well-developed and well-nourished, in no significant distress and comfortable Head: Normocephalic and atraumatic  Eyes: Conjunctivae and EOM are normal. Pupils are equal, round, and reactive to light Right Ear: External ear  normal without discharge Left Ear: External ear normal without discharge Nose: Nose without discharge or deformity Mouth/Throat: Oropharynx is without other ulcerations and moist  Neck: Normal range of motion. Neck supple. No JVD present. No tracheal deviation present or  significant neck LA or mass Cardiovascular: Normal rate, regular rhythm, normal heart sounds and intact distal pulses.   Pulmonary/Chest: WOB normal and breath sounds without rales or wheezing  Abdominal: Soft. Bowel sounds are normal. NT. No HSM  Musculoskeletal: Normal range of motion. Exhibits no edema Lymphadenopathy: Has no other cervical adenopathy.  Neurological: Pt is alert and oriented to person, place, and time. Pt has normal reflexes. No cranial nerve deficit. Motor grossly intact, Gait intact Skin: Skin is warm and dry. No rash noted or new ulcerations Psychiatric:  Has normal mood and affect. Behavior is normal without agitation No other exam findings Lab Results  Component Value Date   WBC 4.1 05/29/2018   HGB 11.8 (L) 05/29/2018   HCT 36.8 05/29/2018   PLT 247.0 05/29/2018   GLUCOSE 100 (H) 05/29/2018   CHOL 125 05/29/2018   TRIG 68.0 05/29/2018   HDL 37.60 (L) 05/29/2018   LDLCALC 74 05/29/2018   ALT 20 05/29/2018   AST 19 05/29/2018   NA 139 05/29/2018   K 4.0 05/29/2018   CL 106 05/29/2018   CREATININE 0.92 05/29/2018   BUN 12 05/29/2018   CO2 25 05/29/2018   TSH 3.32 05/29/2018       Assessment & Plan:

## 2018-05-30 LAB — HIV ANTIBODY (ROUTINE TESTING W REFLEX): HIV: NONREACTIVE

## 2018-06-01 NOTE — Telephone Encounter (Signed)
Ok for urology referral.  

## 2018-06-02 ENCOUNTER — Telehealth: Payer: Self-pay | Admitting: Internal Medicine

## 2018-06-02 NOTE — Telephone Encounter (Signed)
-----   Message from Merril Abbe, [place] sent at 06/02/2018  9:19 AM EDT ----- Juluis Rainier:  Your patient is scheduled to see Dennison Bulla on 06/26/18. Pt is aware.  Thank you for the referral.

## 2018-06-26 ENCOUNTER — Ambulatory Visit: Payer: BC Managed Care – PPO | Admitting: Psychology

## 2018-06-26 DIAGNOSIS — F4321 Adjustment disorder with depressed mood: Secondary | ICD-10-CM

## 2018-07-10 ENCOUNTER — Ambulatory Visit (INDEPENDENT_AMBULATORY_CARE_PROVIDER_SITE_OTHER): Payer: BC Managed Care – PPO | Admitting: Psychology

## 2018-07-10 DIAGNOSIS — F4321 Adjustment disorder with depressed mood: Secondary | ICD-10-CM

## 2018-08-07 ENCOUNTER — Ambulatory Visit: Payer: BC Managed Care – PPO | Admitting: Psychology

## 2019-07-01 ENCOUNTER — Telehealth: Payer: Self-pay | Admitting: Internal Medicine

## 2019-07-01 MED ORDER — LEVOTHYROXINE SODIUM 50 MCG PO TABS: 50.0000 ug | ORAL_TABLET | Freq: Every day | ORAL | 3 refills | Status: DC

## 2019-07-01 NOTE — Telephone Encounter (Signed)
I have not seen since 2018 and cannot fill, you can ask Dr Jenny Reichmann if desired as he saw her for wellness last year. Perhaps she was to switch then and it was just not done.

## 2019-07-01 NOTE — Telephone Encounter (Signed)
Medication refill: levothyroxine (SYNTHROID, LEVOTHROID) 50 MCG tablet [id number]  Pharmacy:  [facility] [address], [place] - [address] [phone] (Phone) [phone] (Fax)    Pt would like a TOC to Dr Jenny Reichmann and would like just a 30 day supply and until this is figured out. Please advise

## 2019-07-01 NOTE — Telephone Encounter (Signed)
Done erx 

## 2019-07-01 NOTE — Telephone Encounter (Signed)
Copied from [place] [id number]. Topic: Appointment Scheduling - Transfer of Care >> Jul 01, 2019  8:58 AM Rayann Heman wrote: Pt is requesting to transfer FROM: Lauren Perkins  Pt is requesting to transfer TO: Lauren Perkins  Reason for requested transfer: because she was seen by dr Lauren Perkins on last physical   Send CRM to patient's current PCP (transferring FROM).

## 2019-07-01 NOTE — Telephone Encounter (Signed)
Ok with me 

## 2019-07-01 NOTE — Telephone Encounter (Signed)
Fine with me

## 2019-07-02 NOTE — Telephone Encounter (Signed)
CPE scheduled  

## 2019-07-12 ENCOUNTER — Encounter: Payer: BC Managed Care – PPO | Admitting: Internal Medicine

## 2019-07-13 ENCOUNTER — Encounter: Payer: Self-pay | Admitting: *Deleted

## 2019-07-14 ENCOUNTER — Other Ambulatory Visit: Payer: Self-pay

## 2019-07-14 ENCOUNTER — Other Ambulatory Visit: Payer: Self-pay | Admitting: Internal Medicine

## 2019-07-14 ENCOUNTER — Other Ambulatory Visit (INDEPENDENT_AMBULATORY_CARE_PROVIDER_SITE_OTHER): Payer: BC Managed Care – PPO

## 2019-07-14 ENCOUNTER — Encounter: Payer: Self-pay | Admitting: Internal Medicine

## 2019-07-14 ENCOUNTER — Ambulatory Visit (INDEPENDENT_AMBULATORY_CARE_PROVIDER_SITE_OTHER): Payer: BC Managed Care – PPO | Admitting: Internal Medicine

## 2019-07-14 VITALS — BP 142/92 | HR 87 | Temp 98.5°F | Ht 69.0 in | Wt 235.0 lb

## 2019-07-14 DIAGNOSIS — E538 Deficiency of other specified B group vitamins: Secondary | ICD-10-CM | POA: Diagnosis not present

## 2019-07-14 DIAGNOSIS — R739 Hyperglycemia, unspecified: Secondary | ICD-10-CM

## 2019-07-14 DIAGNOSIS — Z0000 Encounter for general adult medical examination without abnormal findings: Secondary | ICD-10-CM | POA: Diagnosis not present

## 2019-07-14 DIAGNOSIS — E611 Iron deficiency: Secondary | ICD-10-CM

## 2019-07-14 DIAGNOSIS — I10 Essential (primary) hypertension: Secondary | ICD-10-CM

## 2019-07-14 DIAGNOSIS — E039 Hypothyroidism, unspecified: Secondary | ICD-10-CM

## 2019-07-14 DIAGNOSIS — E559 Vitamin D deficiency, unspecified: Secondary | ICD-10-CM

## 2019-07-14 LAB — BASIC METABOLIC PANEL
BUN: 11 mg/dL (ref 6–23)
CO2: 26 mEq/L (ref 19–32)
Calcium: 9.3 mg/dL (ref 8.4–10.5)
Chloride: 104 mEq/L (ref 96–112)
Creatinine, Ser: 1.03 mg/dL (ref 0.40–1.20)
GFR: 68.61 mL/min (ref >60.00)
Glucose, Bld: 141 mg/dL — ABNORMAL HIGH (ref 70–99)
Potassium: 3.7 mEq/L (ref 3.5–5.1)
Sodium: 139 mEq/L (ref 135–145)

## 2019-07-14 LAB — VITAMIN B12: Vitamin B-12: 307 pg/mL (ref 211–911)

## 2019-07-14 LAB — CBC WITH DIFFERENTIAL/PLATELET
Basophils Absolute: 0 10*3/uL (ref 0.0–0.1)
Basophils Relative: 0.6 % (ref 0.0–3.0)
Eosinophils Absolute: 0.1 10*3/uL (ref 0.0–0.7)
Eosinophils Relative: 1.5 % (ref 0.0–5.0)
HCT: 39.4 % (ref 36.0–46.0)
Hemoglobin: 13.2 g/dL (ref 12.0–15.0)
Lymphocytes Relative: 39.7 % (ref 12.0–46.0)
Lymphs Abs: 1.9 10*3/uL (ref 0.7–4.0)
MCHC: 33.5 g/dL (ref 30.0–36.0)
MCV: 92.3 fl (ref 78.0–100.0)
Monocytes Absolute: 0.4 10*3/uL (ref 0.1–1.0)
Monocytes Relative: 7.7 % (ref 3.0–12.0)
Neutro Abs: 2.4 10*3/uL (ref 1.4–7.7)
Neutrophils Relative %: 50.5 % (ref 43.0–77.0)
Platelets: 212 10*3/uL (ref 150.0–400.0)
RBC: 4.27 Mil/uL (ref 3.87–5.11)
RDW: 13.9 % (ref 11.5–15.5)
WBC: 4.7 10*3/uL (ref 4.0–10.5)

## 2019-07-14 LAB — LIPID PANEL
Cholesterol: 148 mg/dL (ref 0–200)
HDL: 31.3 mg/dL — ABNORMAL LOW (ref >39.00)
LDL Cholesterol: 84 mg/dL (ref 0–99)
NonHDL: 117.08
Total CHOL/HDL Ratio: 5
Triglycerides: 163 mg/dL — ABNORMAL HIGH (ref 0.0–149.0)
VLDL: 32.6 mg/dL (ref 0.0–40.0)

## 2019-07-14 LAB — URINALYSIS, ROUTINE W REFLEX MICROSCOPIC
Bilirubin Urine: NEGATIVE
Ketones, ur: NEGATIVE
Nitrite: NEGATIVE
Specific Gravity, Urine: 1.02 (ref 1.000–1.030)
Total Protein, Urine: NEGATIVE
Urine Glucose: NEGATIVE
Urobilinogen, UA: 1 (ref 0.0–1.0)
pH: 6.5 (ref 5.0–8.0)

## 2019-07-14 LAB — TSH: TSH: 1.53 u[IU]/mL (ref 0.35–4.50)

## 2019-07-14 LAB — HEPATIC FUNCTION PANEL
ALT: 20 U/L (ref 0–35)
AST: 17 U/L (ref 0–37)
Albumin: 4.2 g/dL (ref 3.5–5.2)
Alkaline Phosphatase: 60 U/L (ref 39–117)
Bilirubin, Direct: 0.1 mg/dL (ref 0.0–0.3)
Total Bilirubin: 0.3 mg/dL (ref 0.2–1.2)
Total Protein: 7.4 g/dL (ref 6.0–8.3)

## 2019-07-14 LAB — IBC PANEL
Iron: 67 ug/dL (ref 42–145)
Saturation Ratios: 18.3 % — ABNORMAL LOW (ref 20.0–50.0)
Transferrin: 262 mg/dL (ref 212.0–360.0)

## 2019-07-14 LAB — VITAMIN D 25 HYDROXY (VIT D DEFICIENCY, FRACTURES): VITD: 10.55 ng/mL — ABNORMAL LOW (ref 30.00–100.00)

## 2019-07-14 LAB — HEMOGLOBIN A1C: Hgb A1c MFr Bld: 6 % (ref 4.6–6.5)

## 2019-07-14 LAB — T4, FREE: Free T4: 0.67 ng/dL (ref 0.60–1.60)

## 2019-07-14 MED ORDER — VITAMIN D (ERGOCALCIFEROL) 1.25 MG (50000 UNIT) PO CAPS: 50000.0000 [IU] | ORAL_CAPSULE | ORAL | 0 refills | Status: DC

## 2019-07-14 NOTE — Assessment & Plan Note (Signed)
For lab f/u 

## 2019-07-14 NOTE — Assessment & Plan Note (Signed)

## 2019-07-14 NOTE — Assessment & Plan Note (Signed)
stable overall by history and exam, recent data reviewed with pt, and pt to continue medical treatment as before,  to f/u any worsening symptoms or concerns  

## 2019-07-14 NOTE — Patient Instructions (Signed)
Please continue all other medications as before, and refills have been done if requested.  Please have the pharmacy call with any other refills you may need.  Please continue your efforts at being more active, low cholesterol diet, and weight control.  You are otherwise up to date with prevention measures today.  Please keep your appointments with your specialists as you may have planned  You will be contacted regarding the referral for: GYN, and colonoscopy  Please go to the LAB in the Basement (turn left off the elevator) for the tests to be done today  You will be contacted by phone if any changes need to be made immediately.  Otherwise, you will receive a letter about your results with an explanation, but please check with MyChart first.  Please remember to sign up for MyChart if you have not done so, as this will be important to you in the future with finding out test results, communicating by private email, and scheduling acute appointments online when needed.  Please return in 1 year for your yearly visit, or sooner if needed

## 2019-07-14 NOTE — Progress Notes (Signed)
Subjective:    Patient ID: Lauren Perkins, female    DOB: July 08, 1971, 48 y.o.   MRN: [id number]  HPI Here for wellness and f/u;  Overall doing ok;  Pt denies Chest pain, worsening SOB, DOE, wheezing, orthopnea, PND, worsening LE edema, palpitations, dizziness or syncope.  Pt denies neurological change such as new headache, facial or extremity weakness.  Pt denies polydipsia, polyuria, or low sugar symptoms. Pt states overall good compliance with treatment and medications, good tolerability, and has been trying to follow appropriate diet.  Pt denies worsening depressive symptoms, suicidal ideation or panic. No fever, night sweats, wt loss, loss of appetite, or other constitutional symptoms.  Pt states good ability with ADL's, has low fall risk, home safety reviewed and adequate, no other significant changes in hearing or vision, and only occasionally active with exercise. Gained wt with pandemic.  BP at home < 140/90 Wt Readings from Last 3 Encounters:  07/14/19 235 lb (106.6 kg)  05/29/18 220 lb (99.8 kg)  06/27/17 224 lb (101.6 kg)  Denies hyper or hypo thyroid symptoms such as voice, skin or hair change.  No new complaints Past Medical History:  Diagnosis Date  . Cancer ([place])   . Fibroids   . Hypothyroidism    Past Surgical History:  Procedure Laterality Date  . BREAST LUMPECTOMY Left [id number]   atipical hyperplasia  . EYE SURGERY     right eye when she was 3    reports that she has never smoked. She has never used smokeless tobacco. She reports that she does not drink alcohol or use drugs. family history includes Alcohol abuse in her father; Arthritis in her mother; Cancer in her paternal aunt; Diabetes in her mother; Heart disease in her mother; Hypertension in her mother; Mental illness in her father. No Known Allergies Current Outpatient Medications on File Prior to Visit  Medication Sig Dispense Refill  . levothyroxine (SYNTHROID) 50 MCG tablet Take 1 tablet (50 mcg total)  by mouth daily before breakfast. 90 tablet 3  . valACYclovir (VALTREX) 500 MG tablet Take 1 tablet (500 mg total) by mouth 2 (two) times daily. 30 tablet prn   No current facility-administered medications on file prior to visit.    Review of Systems Constitutional: Negative for other unusual diaphoresis, sweats, appetite or weight changes HENT: Negative for other worsening hearing loss, ear pain, facial swelling, mouth sores or neck stiffness.   Eyes: Negative for other worsening pain, redness or other visual disturbance.  Respiratory: Negative for other stridor or swelling Cardiovascular: Negative for other palpitations or other chest pain  Gastrointestinal: Negative for worsening diarrhea or loose stools, blood in stool, distention or other pain Genitourinary: Negative for hematuria, flank pain or other change in urine volume.  Musculoskeletal: Negative for myalgias or other joint swelling.  Skin: Negative for other color change, or other wound or worsening drainage.  Neurological: Negative for other syncope or numbness. Hematological: Negative for other adenopathy or swelling Psychiatric/Behavioral: Negative for hallucinations, other worsening agitation, SI, self-injury, or new decreased concentration All otherwise neg per pt    Objective:   Physical Exam BP (!) 142/92 (BP Location: Left Arm, Patient Position: Sitting, Cuff Size: Large)   Pulse 87   Temp 98.5 F (36.9 C) (Oral)   Ht 5\' 9"  (1.753 m)   Wt 235 lb (106.6 kg)   SpO2 97%   BMI 34.70 kg/m  VS noted,  Constitutional: Pt is oriented to person, place, and time. Appears well-developed and well-nourished,  in no significant distress and comfortable Head: Normocephalic and atraumatic  Eyes: Conjunctivae and EOM are normal. Pupils are equal, round, and reactive to light Right Ear: External ear normal without discharge Left Ear: External ear normal without discharge Nose: Nose without discharge or deformity Mouth/Throat:  Oropharynx is without other ulcerations and moist  Neck: Normal range of motion. Neck supple. No JVD present. No tracheal deviation present or significant neck LA or mass Cardiovascular: Normal rate, regular rhythm, normal heart sounds and intact distal pulses.   Pulmonary/Chest: WOB normal and breath sounds without rales or wheezing  Abdominal: Soft. Bowel sounds are normal. NT. No HSM  Musculoskeletal: Normal range of motion. Exhibits no edema Lymphadenopathy: Has no other cervical adenopathy.  Neurological: Pt is alert and oriented to person, place, and time. Pt has normal reflexes. No cranial nerve deficit. Motor grossly intact, Gait intact Skin: Skin is warm and dry. No rash noted or new ulcerations Psychiatric:  Has normal mood and affect. Behavior is normal without agitation All otherwise neg per pt Lab Results  Component Value Date   WBC 4.1 05/29/2018   HGB 11.8 (L) 05/29/2018   HCT 36.8 05/29/2018   PLT 247.0 05/29/2018   GLUCOSE 100 (H) 05/29/2018   CHOL 125 05/29/2018   TRIG 68.0 05/29/2018   HDL 37.60 (L) 05/29/2018   LDLCALC 74 05/29/2018   ALT 20 05/29/2018   AST 19 05/29/2018   NA 139 05/29/2018   K 4.0 05/29/2018   CL 106 05/29/2018   CREATININE 0.92 05/29/2018   BUN 12 05/29/2018   CO2 25 05/29/2018   TSH 3.32 05/29/2018      Assessment & Plan:

## 2019-07-22 ENCOUNTER — Encounter: Payer: Self-pay | Admitting: Gastroenterology

## 2019-08-11 ENCOUNTER — Ambulatory Visit (AMBULATORY_SURGERY_CENTER): Payer: BC Managed Care – PPO | Admitting: *Deleted

## 2019-08-11 ENCOUNTER — Other Ambulatory Visit: Payer: Self-pay

## 2019-08-11 ENCOUNTER — Encounter: Payer: Self-pay | Admitting: Gastroenterology

## 2019-08-11 VITALS — Temp 97.1°F | Ht 69.0 in | Wt 233.0 lb

## 2019-08-11 DIAGNOSIS — Z1159 Encounter for screening for other viral diseases: Secondary | ICD-10-CM

## 2019-08-11 DIAGNOSIS — Z1211 Encounter for screening for malignant neoplasm of colon: Secondary | ICD-10-CM

## 2019-08-11 MED ORDER — SUPREP BOWEL PREP KIT 17.5-3.13-1.6 GM/177ML PO SOLN: 1.0000 | Freq: Once | ORAL | 0 refills | Status: AC

## 2019-08-11 NOTE — Progress Notes (Signed)
Husband in PV temp 96.9- no s/s of covid  No egg or soy allergy known to patient  No issues with past sedation with any surgeries  or procedures, no intubation problems - takes numbing meds longer to take effect  No diet pills per patient No home 02 use per patient  No blood thinners per patient  Pt denies issues with constipation  No A fib or A flutter  EMMI video sent to pt's e mail   cov test 11-30 Monday 3 pm   suprep $15 coupon   Due to the COVID-19 pandemic we are asking patients to follow these guidelines. Please only bring one care partner. Please be aware that your care partner may wait in the car in the parking lot or if they feel like they will be too hot to wait in the car, they may wait in the lobby on the 4th floor. All care partners are required to wear a mask the entire time (we do not have any that we can provide them), they need to practice social distancing, and we will do a Covid check for all patient's and care partners when you arrive. Also we will check their temperature and your temperature. If the care partner waits in their car they need to stay in the parking lot the entire time and we will call them on their cell phone when the patient is ready for discharge so they can bring the car to the front of the building. Also all patient's will need to wear a mask into building.

## 2019-08-23 ENCOUNTER — Other Ambulatory Visit: Payer: Self-pay | Admitting: Gastroenterology

## 2019-08-23 ENCOUNTER — Ambulatory Visit (INDEPENDENT_AMBULATORY_CARE_PROVIDER_SITE_OTHER): Payer: BC Managed Care – PPO

## 2019-08-23 DIAGNOSIS — Z1159 Encounter for screening for other viral diseases: Secondary | ICD-10-CM

## 2019-08-24 LAB — SARS CORONAVIRUS 2 (TAT 6-24 HRS): SARS Coronavirus 2: NEGATIVE

## 2019-08-25 ENCOUNTER — Encounter: Payer: Self-pay | Admitting: Gastroenterology

## 2019-08-25 ENCOUNTER — Ambulatory Visit (AMBULATORY_SURGERY_CENTER): Payer: BC Managed Care – PPO | Admitting: Gastroenterology

## 2019-08-25 ENCOUNTER — Other Ambulatory Visit: Payer: Self-pay

## 2019-08-25 VITALS — BP 126/75 | HR 73 | Temp 98.7°F | Resp 16 | Ht 69.0 in | Wt 233.0 lb

## 2019-08-25 DIAGNOSIS — D125 Benign neoplasm of sigmoid colon: Secondary | ICD-10-CM

## 2019-08-25 DIAGNOSIS — Z1211 Encounter for screening for malignant neoplasm of colon: Secondary | ICD-10-CM

## 2019-08-25 MED ORDER — SODIUM CHLORIDE 0.9 % IV SOLN: 500.0000 mL | Freq: Once | INTRAVENOUS | Status: DC

## 2019-08-25 NOTE — Progress Notes (Signed)
Called to room to assist during endoscopic procedure.  Patient ID and intended procedure confirmed with present staff. Received instructions for my participation in the procedure from the performing physician.  

## 2019-08-25 NOTE — Progress Notes (Signed)
To PACU, VSS. Report to [email or web address] 

## 2019-08-25 NOTE — Patient Instructions (Signed)
HANDOUTS PROVIDED ON: POLYPS   THE POLYPS TAKEN TODAY HAVE BEEN SENT FOR PATHOLOGY.  THE RESULTS CAN TAKE 2-3 WEEKS TO RECEIVE.  BASED ON THE RESULTS IS WHEN YOUR NEXT COLONOSCOPY WILL BE RECOMMENDED.  YOU MAY RESUME YOUR PREVIOUS DIET AND MEDICATION SCHEDULE.  Jackson YOU FOR ALLOWING [place] TO CARE FOR YOU TODAY!!!  YOU HAD AN ENDOSCOPIC PROCEDURE TODAY AT [facility]:   Refer to the procedure report that was given to you for any specific questions about what was found during the examination.  If the procedure report does not answer your questions, please call your gastroenterologist to clarify.  If you requested that your care partner not be given the details of your procedure findings, then the procedure report has been included in a sealed envelope for you to review at your convenience later.  YOU SHOULD EXPECT: Some feelings of bloating in the abdomen. Passage of more gas than usual.  Walking can help get rid of the air that was put into your GI tract during the procedure and reduce the bloating. If you had a lower endoscopy (such as a colonoscopy or flexible sigmoidoscopy) you may notice spotting of blood in your stool or on the toilet paper. If you underwent a bowel prep for your procedure, you may not have a normal bowel movement for a few days.  Please Note:  You might notice some irritation and congestion in your nose or some drainage.  This is from the oxygen used during your procedure.  There is no need for concern and it should clear up in a day or so.  SYMPTOMS TO REPORT IMMEDIATELY:   Following lower endoscopy (colonoscopy or flexible sigmoidoscopy):  Excessive amounts of blood in the stool  Significant tenderness or worsening of abdominal pains  Swelling of the abdomen that is new, acute  Fever of 100F or higher  For urgent or emergent issues, a gastroenterologist can be reached at any hour by calling [phone].   DIET:  We do recommend a small meal at  first, but then you may proceed to your regular diet.  Drink plenty of fluids but you should avoid alcoholic beverages for 24 hours.  ACTIVITY:  You should plan to take it easy for the rest of today and you should NOT DRIVE or use heavy machinery until tomorrow (because of the sedation medicines used during the test).    FOLLOW UP: Our staff will call the number listed on your records 48-72 hours following your procedure to check on you and address any questions or concerns that you may have regarding the information given to you following your procedure. If we do not reach you, we will leave a message.  We will attempt to reach you two times.  During this call, we will ask if you have developed any symptoms of COVID 19. If you develop any symptoms (ie: fever, flu-like symptoms, shortness of breath, cough etc.) before then, please call [phone].  If you test positive for Covid 19 in the 2 weeks post procedure, please call and report this information to [place].    If any biopsies were taken you will be contacted by phone or by letter within the next 1-3 weeks.  Please call us at [phone] if you have not heard about the biopsies in 3 weeks.    SIGNATURES/CONFIDENTIALITY: You and/or your care partner have signed paperwork which will be entered into your electronic medical record.  These signatures attest to the fact that  that the information above on your After Visit Summary has been reviewed and is understood.  Full responsibility of the confidentiality of this discharge information lies with you and/or your care-partner.

## 2019-08-25 NOTE — Op Note (Signed)
[facility] Patient Name: Lauren Perkins Procedure Date: 08/25/2019 9:58 AM MRN: [id number] Endoscopist: Gerrit Heck , MD Age: 48 Referring MD:  Date of Birth: 03-29-1971 Gender: Female Account #: [id number] Procedure:                Colonoscopy Indications:              Screening for colorectal malignant neoplasm, This                            is the patient's first colonoscopy Medicines:                Monitored Anesthesia Care Procedure:                Pre-Anesthesia Assessment:                           - Prior to the procedure, a History and Physical                            was performed, and patient medications and                            allergies were reviewed. The patient's tolerance of                            previous anesthesia was also reviewed. The risks                            and benefits of the procedure and the sedation                            options and risks were discussed with the patient.                            All questions were answered, and informed consent                            was obtained. Prior Anticoagulants: The patient has                            taken no previous anticoagulant or antiplatelet                            agents. ASA Grade Assessment: II - A patient with                            mild systemic disease. After reviewing the risks                            and benefits, the patient was deemed in                            satisfactory condition to undergo the procedure.  After obtaining informed consent, the colonoscope                            was passed under direct vision. Throughout the                            procedure, the patient's blood pressure, pulse, and                            oxygen saturations were monitored continuously. The                            Colonoscope was introduced through the anus and                            advanced to the the  cecum, identified by                            appendiceal orifice and ileocecal valve. The                            colonoscopy was performed without difficulty. The                            patient tolerated the procedure well. The quality                            of the bowel preparation was good. The ileocecal                            valve, appendiceal orifice, and rectum were                            photographed. Scope In: 10:03:23 AM Scope Out: 10:21:13 AM Scope Withdrawal Time: 0 hours 12 minutes 14 seconds  Total Procedure Duration: 0 hours 17 minutes 50 seconds  Findings:                 The perianal and digital rectal examinations were                            normal.                           A 4 mm polyp was found in the sigmoid colon. The                            polyp was sessile. The polyp was removed with a                            cold snare. Resection and retrieval were complete.                            Estimated blood loss was minimal.  The exam was otherwise normal throughout the                            remainder of the colon.                           The retroflexed view of the distal rectum and anal                            verge was normal and showed no anal or rectal                            abnormalities. Complications:            No immediate complications. Estimated Blood Loss:     Estimated blood loss was minimal. Impression:               - One 4 mm polyp in the sigmoid colon, removed with                            a cold snare. Resected and retrieved.                           - The distal rectum and anal verge are normal on                            retroflexion view. Recommendation:           - Patient has a contact number available for                            emergencies. The signs and symptoms of potential                            delayed complications were discussed with the                             patient. Return to normal activities tomorrow.                            Written discharge instructions were provided to the                            patient.                           - Resume previous diet.                           - Continue present medications.                           - Await pathology results.                           - Repeat colonoscopy in 7-10 years for surveillance  based on pathology results.                           - Return to GI office PRN. Gerrit Heck, MD 08/25/2019 10:25:20 AM

## 2019-08-27 ENCOUNTER — Telehealth: Payer: Self-pay | Admitting: *Deleted

## 2019-08-27 NOTE — Telephone Encounter (Signed)
  Follow up Call-  Call back number 08/25/2019  Post procedure Call Back phone  # [phone]  Permission to leave phone message Yes  Some recent data might be hidden     Patient questions:  Do you have a fever, pain , or abdominal swelling? No. Pain Score  0 *  Have you tolerated food without any problems? Yes.    Have you been able to return to your normal activities? Yes.    Do you have any questions about your discharge instructions: Diet   No. Medications  No. Follow up visit  No.  Do you have questions or concerns about your Care? No.  Actions: * If pain score is 4 or above: No action needed, pain <4.   1. Have you developed a fever since your procedure? no  2.   Have you had an respiratory symptoms (SOB or cough) since your procedure? no  3.   Have you tested positive for COVID 19 since your procedure no  4.   Have you had any family members/close contacts diagnosed with the COVID 19 since your procedure?  no   If yes to any of these questions please route to Joylene John, RN and Alphonsa Gin, Therapist, sports.

## 2019-09-02 ENCOUNTER — Other Ambulatory Visit: Payer: Self-pay

## 2019-09-02 ENCOUNTER — Ambulatory Visit (INDEPENDENT_AMBULATORY_CARE_PROVIDER_SITE_OTHER): Payer: BC Managed Care – PPO | Admitting: Obstetrics & Gynecology

## 2019-09-02 ENCOUNTER — Encounter: Payer: Self-pay | Admitting: Obstetrics & Gynecology

## 2019-09-02 VITALS — BP 182/111 | HR 84 | Wt 236.0 lb

## 2019-09-02 DIAGNOSIS — Z1151 Encounter for screening for human papillomavirus (HPV): Secondary | ICD-10-CM

## 2019-09-02 DIAGNOSIS — Z01419 Encounter for gynecological examination (general) (routine) without abnormal findings: Secondary | ICD-10-CM | POA: Diagnosis not present

## 2019-09-02 DIAGNOSIS — Z124 Encounter for screening for malignant neoplasm of cervix: Secondary | ICD-10-CM | POA: Diagnosis not present

## 2019-09-02 DIAGNOSIS — N951 Menopausal and female climacteric states: Secondary | ICD-10-CM

## 2019-09-02 DIAGNOSIS — I10 Essential (primary) hypertension: Secondary | ICD-10-CM

## 2019-09-02 DIAGNOSIS — E669 Obesity, unspecified: Secondary | ICD-10-CM

## 2019-09-02 DIAGNOSIS — Z113 Encounter for screening for infections with a predominantly sexual mode of transmission: Secondary | ICD-10-CM | POA: Diagnosis not present

## 2019-09-02 DIAGNOSIS — D259 Leiomyoma of uterus, unspecified: Secondary | ICD-10-CM

## 2019-09-02 NOTE — Patient Instructions (Signed)

## 2019-09-02 NOTE — Progress Notes (Signed)
Patient ID: Lauren Perkins, female   DOB: 1971-05-25, 48 y.o.   MRN: [id number]  Chief Complaint  Patient presents with  . Establish Care  well woman exam  HPI Lauren Perkins is a 48 y.o. female.  [id number] Patient's last menstrual period was 06/09/2019. She has menses about every 2 months and has VMS, We discussed her perimenopausal status and she would like to check hormone level. Pap has been normal and [facility] to [facility] HPI  Past Medical History:  Diagnosis Date  . Allergy    seasonal- occ Zyrtec  . Cancer ([place])    atypical hyper plasia left breast   . Fibroids   . Hypothyroidism     Past Surgical History:  Procedure Laterality Date  . BREAST LUMPECTOMY Left [id number]   atipical hyperplasia  . EYE SURGERY     right eye when she was 3    Family History  Problem Relation Age of Onset  . Arthritis Mother   . Heart disease Mother   . Hypertension Mother   . Diabetes Mother   . Alcohol abuse Father   . Mental illness Father   . Cancer Paternal Aunt        breast  . Breast cancer Paternal Aunt   . Colon cancer Neg Hx   . Colon polyps Neg Hx   . Esophageal cancer Neg Hx   . Stomach cancer Neg Hx   . Rectal cancer Neg Hx     Social History Social History   Tobacco Use  . Smoking status: Never Smoker  . Smokeless tobacco: Never Used  Substance Use Topics  . Alcohol use: No    Alcohol/week: 0.0 standard drinks  . Drug use: No    No Known Allergies  Current Outpatient Medications  Medication Sig Dispense Refill  . levothyroxine (SYNTHROID) 50 MCG tablet Take 1 tablet (50 mcg total) by mouth daily before breakfast. 90 tablet 3  . valACYclovir (VALTREX) 500 MG tablet Take 1 tablet (500 mg total) by mouth 2 (two) times daily. 30 tablet prn  . Vitamin D, Ergocalciferol, (DRISDOL) 1.25 MG (50000 UT) CAPS capsule Take 1 capsule (50,000 Units total) by mouth every 7 (seven) days. 12 capsule 0   Current  Facility-Administered Medications  Medication Dose Route Frequency Provider Last Rate Last Admin  . 0.9 %  sodium chloride infusion  500 mL Intravenous Once Cirigliano, Vito V, DO        Review of Systems Review of Systems  Constitutional: Negative.   Respiratory: Negative.   Cardiovascular: Negative.   Gastrointestinal: Negative.   Genitourinary: Positive for vaginal pain (irritation occasionally).  Neurological: Negative.     Blood pressure (!) 182/111, pulse 84, weight 236 lb (107 kg), last menstrual period 06/09/2019.  Physical Exam Physical Exam Constitutional:      Appearance: Normal appearance. She is obese.  HENT:     Head: Normocephalic.  Eyes:     Pupils: Pupils are equal, round, and reactive to light.  Cardiovascular:     Rate and Rhythm: Normal rate.     Heart sounds: Normal heart sounds.  Pulmonary:     Effort: Pulmonary effort is normal.     Breath sounds: Normal breath sounds.  Abdominal:     General: Abdomen is flat.     Palpations: Abdomen is soft.  Genitourinary:    Comments: Pelvic exam: normal external genitalia, vulva, vagina, cervix, uterus and adnexa, no masses.  Musculoskeletal:  Cervical back: Normal range of motion.  Neurological:     Mental Status: She is alert.   Breasts: breasts appear normal, no suspicious masses, no skin or nipple changes or axillary nodes, surgical scars noted left lateral.   Data Reviewed Pelvic US 2007 fibroids Surgical path 08/25/2019   Assessment Well woman exam with routine gynecological exam - Plan: Cytology - PAP( [place]), [place]  Perimenopause  Class 1 obesity with body mass index (BMI) of 34.0 to 34.9 in adult, unspecified obesity type, unspecified whether serious comorbidity present  Essential hypertension elevated BP today and she should f/u with Dr. Judi Cong   Plan PCP f/u [facility] level ordered Mammogram as indicated RTC 1 year   Emeterio Reeve 09/02/2019, 1:47 PM

## 2019-09-03 LAB — FOLLICLE STIMULATING HORMONE: FSH: 22.6 m[IU]/mL

## 2019-09-07 ENCOUNTER — Encounter: Payer: Self-pay | Admitting: Gastroenterology

## 2019-09-09 LAB — CYTOLOGY - PAP
Chlamydia: NEGATIVE
Comment: NEGATIVE
Comment: NEGATIVE
Comment: NEGATIVE
Comment: NEGATIVE
Comment: NEGATIVE
Comment: NORMAL
Diagnosis: NEGATIVE
HPV 16: NEGATIVE
HPV 18 / 45: NEGATIVE
High risk HPV: POSITIVE — AB
Neisseria Gonorrhea: NEGATIVE
Trichomonas: NEGATIVE

## 2019-10-18 ENCOUNTER — Telehealth: Payer: Self-pay | Admitting: Internal Medicine

## 2019-10-18 NOTE — Telephone Encounter (Signed)
[place] for [facility]

## 2019-10-18 NOTE — Telephone Encounter (Signed)
Would like to know if levothyroxine could be substituted due to not having the normal manufacturers meds.  Would like to know if they could substitute with Euthyrox?

## 2019-10-18 NOTE — Telephone Encounter (Signed)
Notified pharmacist w/MD response.../lmb 

## 2019-10-29 ENCOUNTER — Ambulatory Visit (INDEPENDENT_AMBULATORY_CARE_PROVIDER_SITE_OTHER): Payer: BC Managed Care – PPO | Admitting: Internal Medicine

## 2019-10-29 VITALS — Temp 97.4°F

## 2019-10-29 DIAGNOSIS — R739 Hyperglycemia, unspecified: Secondary | ICD-10-CM

## 2019-10-29 DIAGNOSIS — U071 COVID-19: Secondary | ICD-10-CM

## 2019-10-29 DIAGNOSIS — I10 Essential (primary) hypertension: Secondary | ICD-10-CM

## 2019-10-29 MED ORDER — HYDROCODONE-HOMATROPINE 5-1.5 MG/5ML PO SYRP: 5.0000 mL | ORAL_SOLUTION | Freq: Four times a day (QID) | ORAL | 0 refills | Status: AC | PRN

## 2019-10-29 MED ORDER — ALBUTEROL SULFATE HFA 108 (90 BASE) MCG/ACT IN AERS: 2.0000 | INHALATION_SPRAY | Freq: Four times a day (QID) | RESPIRATORY_TRACT | 1 refills | Status: DC | PRN

## 2019-10-29 NOTE — Progress Notes (Signed)
Patient ID: Lauren Perkins, female   DOB: 1970/12/22, 49 y.o.   MRN: [id number]  Virtual Visit via Video Note  I connected with Lauren Perkins on 10/29/19 at 10:20 AM EST by a video enabled telemedicine application and verified that I am speaking with the correct person using two identifiers.  Location: Patient: at home Provider: at office   I discussed the limitations of evaluation and management by telemedicine and the availability of in person appointments. The patient expressed understanding and agreed to proceed.  History of Present Illness:  Here with 2-3 days acute onset fever, facial pain, pressure, headache, general weakness and malaise, and ST and sob and non prod cough, but pt denies chest pain, wheezing, orthopnea, PND, increased LE swelling, palpitations, dizziness or syncope. Pt is COVID + since feb 3.  Pt denies new neurological symptoms such as new headache, or facial or extremity weakness or numbness   Pt denies polydipsia, polyuria Past Medical History:  Diagnosis Date  . Allergy    seasonal- occ Zyrtec  . Cancer ([place])    atypical hyper plasia left breast   . Fibroids   . Hypothyroidism    Past Surgical History:  Procedure Laterality Date  . BREAST LUMPECTOMY Left [id number]   atipical hyperplasia  . EYE SURGERY     right eye when she was 3    reports that she has never smoked. She has never used smokeless tobacco. She reports that she does not drink alcohol or use drugs. family history includes Alcohol abuse in her father; Arthritis in her mother; Breast cancer in her paternal aunt; Cancer in her paternal aunt; Diabetes in her mother; Heart disease in her mother; Hypertension in her mother; Mental illness in her father. No Known Allergies Current Outpatient Medications on File Prior to Visit  Medication Sig Dispense Refill  . levothyroxine (SYNTHROID) 50 MCG tablet Take 1 tablet (50 mcg total) by mouth daily before breakfast. 90 tablet 3  . valACYclovir  (VALTREX) 500 MG tablet Take 1 tablet (500 mg total) by mouth 2 (two) times daily. 30 tablet prn  . Vitamin D, Ergocalciferol, (DRISDOL) 1.25 MG (50000 UT) CAPS capsule Take 1 capsule (50,000 Units total) by mouth every 7 (seven) days. 12 capsule 0   Current Facility-Administered Medications on File Prior to Visit  Medication Dose Route Frequency Provider Last Rate Last Admin  . 0.9 %  sodium chloride infusion  500 mL Intravenous Once Cirigliano, Vito V, DO         Observations/Objective: Alert, NAD, appropriate mood and affect, resps normal, cn 2-12 intact, moves all 4s, no visible rash or swelling Lab Results  Component Value Date   WBC 4.7 07/14/2019   HGB 13.2 07/14/2019   HCT 39.4 07/14/2019   PLT 212.0 07/14/2019   GLUCOSE 141 (H) 07/14/2019   CHOL 148 07/14/2019   TRIG 163.0 (H) 07/14/2019   HDL 31.30 (L) 07/14/2019   LDLCALC 84 07/14/2019   ALT 20 07/14/2019   AST 17 07/14/2019   NA 139 07/14/2019   K 3.7 07/14/2019   CL 104 07/14/2019   CREATININE 1.03 07/14/2019   BUN 11 07/14/2019   CO2 26 07/14/2019   TSH 1.53 07/14/2019   HGBA1C 6.0 07/14/2019    Assessment and Plan: See notes  Follow Up Instructions: See notes   I discussed the assessment and treatment plan with the patient. The patient was provided an opportunity to ask questions and all were answered. The patient agreed with the plan  and demonstrated an understanding of the instructions.   The patient was advised to call back or seek an in-person evaluation if the symptoms worsen or if the condition fails to improve as anticipated.  [name], MD

## 2019-10-31 ENCOUNTER — Encounter: Payer: Self-pay | Admitting: Internal Medicine

## 2019-10-31 NOTE — Assessment & Plan Note (Signed)
Encouraged to f/u BP at home and next visit 

## 2019-10-31 NOTE — Assessment & Plan Note (Signed)
stable overall by history and exam, recent data reviewed with pt, and pt to continue medical treatment as before,  to f/u any worsening symptoms or concerns  

## 2019-10-31 NOTE — Assessment & Plan Note (Addendum)
For cough med prn, albuterol hfa prn,  to f/u any worsening symptoms or concerns  I spent 30 minutes preparing to see the patient by review of recent labs, imaging and procedures, obtaining and reviewing separately obtained history, communicating with the patient and family or caregiver, ordering medications, tests or procedures, and documenting clinical information in the EHR including the differential Dx, treatment, and any further evaluation and other management of covid 19 infecction, hyperglycemia, HTN

## 2019-10-31 NOTE — Patient Instructions (Signed)
Please take all new medication as prescribed  Please go to ED for any worsening sob

## 2019-11-04 ENCOUNTER — Ambulatory Visit (INDEPENDENT_AMBULATORY_CARE_PROVIDER_SITE_OTHER): Payer: BC Managed Care – PPO | Admitting: Internal Medicine

## 2019-11-04 ENCOUNTER — Encounter: Payer: Self-pay | Admitting: Internal Medicine

## 2019-11-04 DIAGNOSIS — I10 Essential (primary) hypertension: Secondary | ICD-10-CM

## 2019-11-04 DIAGNOSIS — U071 COVID-19: Secondary | ICD-10-CM | POA: Diagnosis not present

## 2019-11-04 DIAGNOSIS — R739 Hyperglycemia, unspecified: Secondary | ICD-10-CM

## 2019-11-04 NOTE — Progress Notes (Signed)
Patient ID: Lauren Perkins, female   DOB: 1970/12/06, 49 y.o.   MRN: [id number]  Virtual Visit via Video Note  I connected with Lauren Perkins on 11/04/19 at 11:20 AM EST by a video enabled telemedicine application and verified that I am speaking with the correct person using two identifiers.  Location: Patient: at home Provider: at office   I discussed the limitations of evaluation and management by telemedicine and the availability of in person appointments. The patient expressed understanding and agreed to proceed.  History of Present Illness: Here after osnet symptoms covid 1/29, then found covid +2/4, now with mild sob, cough and loss of taste and smell.   Past Medical History:  Diagnosis Date  . Allergy    seasonal- occ Zyrtec  . Cancer ([place])    atypical hyper plasia left breast   . Fibroids   . Hypothyroidism    Past Surgical History:  Procedure Laterality Date  . BREAST LUMPECTOMY Left [id number]   atipical hyperplasia  . EYE SURGERY     right eye when she was 3    reports that she has never smoked. She has never used smokeless tobacco. She reports that she does not drink alcohol or use drugs. family history includes Alcohol abuse in her father; Arthritis in her mother; Breast cancer in her paternal aunt; Cancer in her paternal aunt; Diabetes in her mother; Heart disease in her mother; Hypertension in her mother; Mental illness in her father. No Known Allergies Current Outpatient Medications on File Prior to Visit  Medication Sig Dispense Refill  . albuterol (VENTOLIN HFA) 108 (90 Base) MCG/ACT inhaler Inhale 2 puffs into the lungs every 6 (six) hours as needed for wheezing or shortness of breath. 18 g 1  . HYDROcodone-homatropine (HYCODAN) 5-1.5 MG/5ML syrup Take 5 mLs by mouth every 6 (six) hours as needed for up to 10 days for cough. 180 mL 0  . levothyroxine (SYNTHROID) 50 MCG tablet Take 1 tablet (50 mcg total) by mouth daily before breakfast. 90 tablet 3  .  valACYclovir (VALTREX) 500 MG tablet Take 1 tablet (500 mg total) by mouth 2 (two) times daily. 30 tablet prn  . Vitamin D, Ergocalciferol, (DRISDOL) 1.25 MG (50000 UT) CAPS capsule Take 1 capsule (50,000 Units total) by mouth every 7 (seven) days. 12 capsule 0   No current facility-administered medications on file prior to visit.    Observations/Objective: Alert, NAD, appropriate mood and affect, resps normal, cn 2-12 intact, moves all 4s, no visible rash or swelling Lab Results  Component Value Date   WBC 4.7 07/14/2019   HGB 13.2 07/14/2019   HCT 39.4 07/14/2019   PLT 212.0 07/14/2019   GLUCOSE 141 (H) 07/14/2019   CHOL 148 07/14/2019   TRIG 163.0 (H) 07/14/2019   HDL 31.30 (L) 07/14/2019   LDLCALC 84 07/14/2019   ALT 20 07/14/2019   AST 17 07/14/2019   NA 139 07/14/2019   K 3.7 07/14/2019   CL 104 07/14/2019   CREATININE 1.03 07/14/2019   BUN 11 07/14/2019   CO2 26 07/14/2019   TSH 1.53 07/14/2019   HGBA1C 6.0 07/14/2019   Assessment and Plan: See notes  Follow Up Instructions: See notes   I discussed the assessment and treatment plan with the patient. The patient was provided an opportunity to ask questions and all were answered. The patient agreed with the plan and demonstrated an understanding of the instructions.   The patient was advised to call back or seek an  in-person evaluation if the symptoms worsen or if the condition fails to improve as anticipated.  [name], MD

## 2019-11-04 NOTE — Assessment & Plan Note (Addendum)
With mild persitent maybe worsening symptoms of sob, cough - for work note, refer resp clinic, and now on vaccine wait list  I spent 30 minutes preparing to see the patient by review of recent labs, imaging and procedures, obtaining and reviewing separately obtained history, communicating with the patient and family or caregiver, ordering medications, tests or procedures, and documenting clinical information in the EHR including the differential Dx, treatment, and any further evaluation and other management of

## 2019-11-04 NOTE — Assessment & Plan Note (Signed)
Encourage to f/u BP at home and next visit

## 2019-11-04 NOTE — Assessment & Plan Note (Signed)
stable overall by history and exam, recent data reviewed with pt, and pt to continue medical treatment as before,  to f/u any worsening symptoms or concerns  

## 2019-11-04 NOTE — Patient Instructions (Signed)
You are given the work note  You are on the [employer] Wait list  We will let you know about being seen hopefully later today at the [facility] (at [place])  Please continue all other medications as before, and refills have been done if requested.  Please have the pharmacy call with any other refills you may need.  Please keep your appointments with your specialists as you may have planned

## 2019-11-05 ENCOUNTER — Emergency Department (HOSPITAL_COMMUNITY): Payer: BC Managed Care – PPO

## 2019-11-05 ENCOUNTER — Encounter: Payer: Self-pay | Admitting: Internal Medicine

## 2019-11-05 ENCOUNTER — Telehealth: Payer: Self-pay

## 2019-11-05 ENCOUNTER — Emergency Department (HOSPITAL_COMMUNITY)
Admission: EM | Admit: 2019-11-05 | Discharge: 2019-11-05 | Disposition: A | Discharge status: Home / Self Care | Payer: BC Managed Care – PPO | Attending: Emergency Medicine | Admitting: Emergency Medicine

## 2019-11-05 ENCOUNTER — Ambulatory Visit (INDEPENDENT_AMBULATORY_CARE_PROVIDER_SITE_OTHER): Payer: BC Managed Care – PPO | Admitting: Internal Medicine

## 2019-11-05 ENCOUNTER — Other Ambulatory Visit: Payer: Self-pay

## 2019-11-05 VITALS — BP 160/96 | HR 70 | Temp 97.8°F | Wt 242.4 lb

## 2019-11-05 DIAGNOSIS — R071 Chest pain on breathing: Secondary | ICD-10-CM | POA: Diagnosis not present

## 2019-11-05 DIAGNOSIS — R079 Chest pain, unspecified: Secondary | ICD-10-CM | POA: Diagnosis not present

## 2019-11-05 DIAGNOSIS — J1282 Pneumonia due to coronavirus disease 2019: Secondary | ICD-10-CM | POA: Insufficient documentation

## 2019-11-05 DIAGNOSIS — I10 Essential (primary) hypertension: Secondary | ICD-10-CM | POA: Diagnosis not present

## 2019-11-05 DIAGNOSIS — E039 Hypothyroidism, unspecified: Secondary | ICD-10-CM | POA: Insufficient documentation

## 2019-11-05 DIAGNOSIS — R0789 Other chest pain: Secondary | ICD-10-CM

## 2019-11-05 DIAGNOSIS — U071 COVID-19: Secondary | ICD-10-CM

## 2019-11-05 DIAGNOSIS — Z79899 Other long term (current) drug therapy: Secondary | ICD-10-CM | POA: Insufficient documentation

## 2019-11-05 DIAGNOSIS — R110 Nausea: Secondary | ICD-10-CM | POA: Diagnosis not present

## 2019-11-05 DIAGNOSIS — R0602 Shortness of breath: Secondary | ICD-10-CM

## 2019-11-05 LAB — COMPREHENSIVE METABOLIC PANEL
ALT: 25 U/L (ref 0–44)
AST: 22 U/L (ref 15–41)
Albumin: 3.6 g/dL (ref 3.5–5.0)
Alkaline Phosphatase: 56 U/L (ref 38–126)
Anion gap: 12 (ref 5–15)
BUN: 11 mg/dL (ref 6–20)
CO2: 21 mmol/L — ABNORMAL LOW (ref 22–32)
Calcium: 9.3 mg/dL (ref 8.9–10.3)
Chloride: 107 mmol/L (ref 98–111)
Creatinine, Ser: 0.93 mg/dL (ref 0.44–1.00)
GFR calc Af Amer: >60 mL/min (ref >60)
GFR calc non Af Amer: >60 mL/min (ref >60)
Glucose, Bld: 130 mg/dL — ABNORMAL HIGH (ref 70–99)
Potassium: 3.9 mmol/L (ref 3.5–5.1)
Sodium: 140 mmol/L (ref 135–145)
Total Bilirubin: 0.6 mg/dL (ref 0.3–1.2)
Total Protein: 7.3 g/dL (ref 6.5–8.1)

## 2019-11-05 LAB — CBC WITH DIFFERENTIAL/PLATELET
Abs Immature Granulocytes: 0.01 10*3/uL (ref 0.00–0.07)
Basophils Absolute: 0 10*3/uL (ref 0.0–0.1)
Basophils Relative: 1 %
Eosinophils Absolute: 0.1 10*3/uL (ref 0.0–0.5)
Eosinophils Relative: 2 %
HCT: 39.7 % (ref 36.0–46.0)
Hemoglobin: 13.2 g/dL (ref 12.0–15.0)
Immature Granulocytes: 0 %
Lymphocytes Relative: 47 %
Lymphs Abs: 2.2 10*3/uL (ref 0.7–4.0)
MCH: 30.8 pg (ref 26.0–34.0)
MCHC: 33.2 g/dL (ref 30.0–36.0)
MCV: 92.8 fL (ref 80.0–100.0)
Monocytes Absolute: 0.4 10*3/uL (ref 0.1–1.0)
Monocytes Relative: 9 %
Neutro Abs: 1.9 10*3/uL (ref 1.7–7.7)
Neutrophils Relative %: 41 %
Platelets: 249 10*3/uL (ref 150–400)
RBC: 4.28 MIL/uL (ref 3.87–5.11)
RDW: 12.5 % (ref 11.5–15.5)
WBC: 4.6 10*3/uL (ref 4.0–10.5)
nRBC: 0 % (ref 0.0–0.2)

## 2019-11-05 LAB — TROPONIN I (HIGH SENSITIVITY): Troponin I (High Sensitivity): <2 ng/L (ref <18)

## 2019-11-05 LAB — D-DIMER, QUANTITATIVE: D-Dimer, Quant: 0.57 ug/mL-FEU — ABNORMAL HIGH (ref 0.00–0.50)

## 2019-11-05 MED ORDER — IOHEXOL 350 MG/ML SOLN
75.0000 mL | Freq: Once | INTRAVENOUS | Status: AC | PRN
  Administered 2019-11-05: 23:00:00 75 mL via INTRAVENOUS

## 2019-11-05 MED ORDER — ONDANSETRON HCL 4 MG/2ML IJ SOLN
4.0000 mg | Freq: Once | INTRAMUSCULAR | Status: AC
  Administered 2019-11-05: 22:00:00 4 mg via INTRAVENOUS
  Filled 2019-11-05: qty 2

## 2019-11-05 MED ORDER — MORPHINE SULFATE (PF) 4 MG/ML IV SOLN
4.0000 mg | Freq: Once | INTRAVENOUS | Status: AC
  Administered 2019-11-05: 22:00:00 4 mg via INTRAVENOUS
  Filled 2019-11-05: qty 1

## 2019-11-05 NOTE — ED Triage Notes (Signed)
Pt arrives via [facility]   Pt tested COVID + on Feb. 3rd Pt was at respiratory clinic at [facility] when she experienced chest pain along with sob and nausea.  Pt endorses chest pain x1week that is worse upon exertion, palpation, and inhalation.   No LOC. A&ox4.

## 2019-11-05 NOTE — ED Notes (Signed)
Patient verbalizes understanding of discharge instructions. Opportunity for questioning and answers were provided. Armband removed by staff, pt discharged from ED via wheelchair.  

## 2019-11-05 NOTE — ED Provider Notes (Signed)
[facility] EMERGENCY DEPARTMENT Provider Note   CSN: [id number] Arrival date & time: 11/05/19  2019     History Chief Complaint  Patient presents with  . Chest Pain    Lauren Perkins is a 49 y.o. female.  Patient is a 49 year old female with known Covid positive who is presenting today with chest pain.  Patient states that she tested positive on 10/27/2019 and for the last 2 weeks has had sharp chest pain in the center of her chest that radiates to the upper back.  Worse with exertion, deep breathing and sitting up too long.  Worsening SOB on exertion.  Minimal cough, no fever, no sputum production.  Patient went to the respiratory clinic today and was sent here for further evaluation.  She has no history of respiratory or cardiac problems.  She denies any unilateral leg pain or swelling.  She is also been nauseated intermittently but specifically all day today so has not eaten as much today is normal.  The history is provided by the patient.  Chest Pain Pain location:  Substernal area Pain quality: sharp and shooting   Pain radiates to:  Upper back Pain severity:  Moderate Onset quality:  Gradual Duration:  2 weeks Timing:  Constant Progression:  Worsening Chronicity:  New Context comment:  Worse with exertion, sitting up to long and slightly worse with deep breathing Relieved by:  Nothing Worsened by:  Deep breathing, movement and exertion Ineffective treatments:  Rest (rest helps some but does not go completely away) Associated symptoms: cough, nausea and shortness of breath   Associated symptoms: no abdominal pain, no fever, no lower extremity edema and no vomiting   Risk factors comment:  Hypothyroidism      Past Medical History:  Diagnosis Date  . Allergy    seasonal- occ Zyrtec  . Cancer ([place])    atypical hyper plasia left breast   . Fibroids   . Hypothyroidism     Patient Active Problem List   Diagnosis Date Noted  . COVID-19 virus  infection 10/29/2019  . Fibroid uterus 09/02/2019  . Vitamin D deficiency 07/14/2019  . Hyperglycemia 07/14/2019  . Wellness examination 05/29/2018  . Vertigo 05/01/2017  . Perimenopause 08/14/2016  . Atypical ductal hyperplasia of left breast 11/28/2015  . Hypothyroidism 06/14/2015  . Essential hypertension 06/14/2015  . DCIS (ductal carcinoma in situ) 06/14/2015  . Obesity 06/14/2015  . Genital herpes, unspecified 04/13/2014    Past Surgical History:  Procedure Laterality Date  . BREAST LUMPECTOMY Left [id number]   atipical hyperplasia  . EYE SURGERY     right eye when she was 3     OB History    Gravida  1   Para  1   Term  1   Preterm      AB      Living  1     SAB      TAB      Ectopic      Multiple      Live Births  1           Family History  Problem Relation Age of Onset  . Arthritis Mother   . Heart disease Mother   . Hypertension Mother   . Diabetes Mother   . Alcohol abuse Father   . Mental illness Father   . Cancer Paternal Aunt        breast  . Breast cancer Paternal Aunt   . Colon cancer Neg  Hx   . Colon polyps Neg Hx   . Esophageal cancer Neg Hx   . Stomach cancer Neg Hx   . Rectal cancer Neg Hx     Social History   Tobacco Use  . Smoking status: Never Smoker  . Smokeless tobacco: Never Used  Substance Use Topics  . Alcohol use: No    Alcohol/week: 0.0 standard drinks  . Drug use: No    Home Medications Prior to Admission medications   Medication Sig Start Date End Date Taking? Authorizing Provider  albuterol (VENTOLIN HFA) 108 (90 Base) MCG/ACT inhaler Inhale 2 puffs into the lungs every 6 (six) hours as needed for wheezing or shortness of breath. 10/29/19   Biagio Borg, MD  HYDROcodone-homatropine [facility]) 5-1.5 MG/5ML syrup Take 5 mLs by mouth every 6 (six) hours as needed for up to 10 days for cough. 10/29/19 11/08/19  Biagio Borg, MD  levothyroxine (SYNTHROID) 50 MCG tablet Take 1 tablet (50 mcg total) by mouth  daily before breakfast. 07/01/19   Biagio Borg, MD  valACYclovir (VALTREX) 500 MG tablet Take 1 tablet (500 mg total) by mouth 2 (two) times daily. 05/29/18   Biagio Borg, MD  Vitamin D, Ergocalciferol, (DRISDOL) 1.25 MG (50000 UT) CAPS capsule Take 1 capsule (50,000 Units total) by mouth every 7 (seven) days. 07/14/19   Biagio Borg, MD    Allergies    Patient has no known allergies.  Review of Systems   Review of Systems  Constitutional: Negative for fever.  Respiratory: Positive for cough and shortness of breath.   Cardiovascular: Positive for chest pain.  Gastrointestinal: Positive for nausea. Negative for abdominal pain and vomiting.  All other systems reviewed and are negative.   Physical Exam Updated Vital Signs BP 131/88   Pulse 73   Temp 97.7 F (36.5 C)   Resp 13   SpO2 98%   Physical Exam Vitals and nursing note reviewed.  Constitutional:      General: She is not in acute distress.    Appearance: She is well-developed and normal weight.  HENT:     Head: Normocephalic and atraumatic.  Eyes:     Pupils: Pupils are equal, round, and reactive to light.  Cardiovascular:     Rate and Rhythm: Normal rate and regular rhythm.     Pulses: Normal pulses.     Heart sounds: Normal heart sounds. No murmur. No friction rub.  Pulmonary:     Effort: Pulmonary effort is normal.     Breath sounds: Normal breath sounds. No wheezing or rales.  Chest:     Chest wall: Tenderness present.    Abdominal:     General: Bowel sounds are normal. There is no distension.     Palpations: Abdomen is soft.     Tenderness: There is no abdominal tenderness. There is no guarding or rebound.  Musculoskeletal:        General: No tenderness. Normal range of motion.     Cervical back: Normal range of motion and neck supple.     Right lower leg: No edema.     Left lower leg: No edema.     Comments: No edema  Skin:    General: Skin is warm and dry.     Findings: No rash.  Neurological:      General: No focal deficit present.     Mental Status: She is alert and oriented to person, place, and time. Mental status is at baseline.  Cranial Nerves: No cranial nerve deficit.  Psychiatric:        Mood and Affect: Mood normal.        Behavior: Behavior normal.        Thought Content: Thought content normal.     ED Results / Procedures / Treatments   Labs (all labs ordered are listed, but only abnormal results are displayed) Labs Reviewed  COMPREHENSIVE METABOLIC PANEL - Abnormal; Notable for the following components:      Result Value   CO2 21 (*)    Glucose, Bld 130 (*)    All other components within normal limits  D-DIMER, QUANTITATIVE (NOT AT [facility]) - Abnormal; Notable for the following components:   D-Dimer, Quant 0.57 (*)    All other components within normal limits  CBC WITH DIFFERENTIAL/PLATELET  TROPONIN I (HIGH SENSITIVITY)  TROPONIN I (HIGH SENSITIVITY)    EKG EKG Interpretation  Date/Time:  Friday November 05 2019 20:29:01 EST Ventricular Rate:  74 PR Interval:    QRS Duration: 85 QT Interval:  359 QTC Calculation: 399 R Axis:   40 Text Interpretation: Sinus rhythm Borderline T wave abnormalities No previous tracing Confirmed by Blanchie Dessert [phone]) on 11/05/2019 8:29:43 PM   Radiology CT Angio Chest PE W and/or Wo Contrast  Result Date: 11/05/2019 CLINICAL DATA:  Shortness of breath EXAM: CT ANGIOGRAPHY CHEST WITH CONTRAST TECHNIQUE: Multidetector CT imaging of the chest was performed using the standard protocol during bolus administration of intravenous contrast. Multiplanar CT image reconstructions and MIPs were obtained to evaluate the vascular anatomy. CONTRAST:  54mL OMNIPAQUE IOHEXOL 350 MG/ML SOLN COMPARISON:  Chest radiograph 11/05/2019 FINDINGS: Cardiovascular: Satisfactory opacification the pulmonary arteries to the segmental level. No pulmonary artery filling defects are identified. Central pulmonary arteries are normal caliber. Normal  heart size. No pericardial effusion. The aorta is normal caliber. Normal branching of the aortic arch. Mediastinum/Nodes: Few reactive appearing subcentimeter low-attenuation mediastinal and hilar nodes are present. No pathologically enlarged mediastinal, hilar or axillary adenopathy. No acute abnormality of the trachea or esophagus. Thyroid gland is not visualized. Thoracic inlet is otherwise unremarkable. Lungs/Pleura: There are multiple sites of consolidative opacity in the lungs including a larger region measuring up 10 3.3 cm in left lung base. As well as opacities in the right upper and lower lobes. No pneumothorax or effusion. There is mild airways thickening. Upper Abdomen: No acute abnormalities present in the visualized portions of the upper abdomen. Musculoskeletal: No acute osseous abnormality or suspicious osseous lesion. Review of the MIP images confirms the above findings. IMPRESSION: 1. No evidence of pulmonary artery embolism 2. Multifocal consolidative opacity in both lungs, consistent with multifocal pneumonia. Consider follow-up imaging in 6-8 weeks after therapeutic intervention to ensure resolution with further recommendations to be based on the imaging appearance at that time. Electronically Signed   By: Lovena Le M.D.   On: 11/05/2019 23:10   DG Chest Port 1 View  Result Date: 11/05/2019 CLINICAL DATA:  Shortness of breath EXAM: PORTABLE CHEST 1 VIEW COMPARISON:  None. FINDINGS: The heart size and mediastinal contours are within normal limits. Both lungs are clear. The visualized skeletal structures are unremarkable. IMPRESSION: No active disease. Electronically Signed   By: Ulyses Jarred M.D.   On: 11/05/2019 21:14    Procedures Procedures (including critical care time)  Medications Ordered in ED Medications  ondansetron (ZOFRAN) injection 4 mg (has no administration in time range)  morphine 4 MG/ML injection 4 mg (has no administration in time range)  ED Course  I have  reviewed the triage vital signs and the nursing notes.  Pertinent labs & imaging results that were available during my care of the patient were reviewed by me and considered in my medical decision making (see chart for details).    MDM Rules/Calculators/A&P                      49 year old female presenting today with atypical chest pain in the setting of having Covid for the last 2 weeks. She is also having shortness of breath at rest but worse with exertion. Patient's vital signs are within normal limits. Breath sounds are clear and pain is reproducible with palpation. EKG with no significant findings for pericarditis. Concern for PE versus pneumonia versus potential myocarditis. Labs and x-ray pending. Patient given something for pain.  10:20 PM Labs are reassuring except for mildly elevated D-dimer at 0.57.  CTA pending.  Chest x-ray without acute findings.  Patient's vital signs remained stable.  11:32 PM CT is negative for PE but does show multifocal pneumonia which is most consistent with known Covid.  Patient's oxygen saturation remains 98% on room air with normal respiratory rate.  Low suspicion at this time that this is bacterial.  Patient was given return precautions.  This is most likely the cause of her atypical chest pain.  DAMA BOGGAN was evaluated in Emergency Department on 11/05/2019 for the symptoms described in the history of present illness. She was evaluated in the context of the global COVID-19 pandemic, which necessitated consideration that the patient might be at risk for infection with the SARS-CoV-2 virus that causes COVID-19. Institutional protocols and algorithms that pertain to the evaluation of patients at risk for COVID-19 are in a state of rapid change based on information released by regulatory bodies including the CDC and federal and state organizations. These policies and algorithms were followed during the patient's care in the ED.   Final Clinical  Impression(s) / ED Diagnoses Final diagnoses:  Pneumonia due to COVID-19 virus  Chest wall pain    Rx / DC Orders ED Discharge Orders    None       Blanchie Dessert, MD 11/05/19 2333

## 2019-11-05 NOTE — Progress Notes (Signed)
This visit occurred during the SARS-CoV-2 public health emergency.  Safety protocols were in place, including screening questions prior to the visit, additional usage of staff PPE, and extensive cleaning of exam room while observing appropriate contact time as indicated for disinfecting solutions.  Subjective:     Patient ID: Lauren Perkins , female    DOB: April 26, 1971 , 49 y.o.   MRN: [id number]   CC " chest tightness and pressure"  HPI  Onset of mild chest pressure 2 weeks ago, around 1/27 described as sharp pains shooting for a few seconds. Tried Zantac but did not help. She admits she was stressed. Then was diagnosed with COVID last week and now the CP is pressure is getting worse in the past 48h, and heaviness and radiates to back. Pain is provoked with deep breath.  Her PCP prescribed her an inhaler, but the pharmacist told her to only use it in case of emergency and she thought it was it she could not breath, so she never used it, and does not have it with her.  Past Medical History:  Diagnosis Date  . Allergy    seasonal- occ Zyrtec  . Cancer ([place])    atypical hyper plasia left breast   . Fibroids   . Hypothyroidism      Family History  Problem Relation Age of Onset  . Arthritis Mother   . Heart disease Mother   . Hypertension Mother   . Diabetes Mother   . Alcohol abuse Father   . Mental illness Father   . Cancer Paternal Aunt        breast  . Breast cancer Paternal Aunt   . Colon cancer Neg Hx   . Colon polyps Neg Hx   . Esophageal cancer Neg Hx   . Stomach cancer Neg Hx   . Rectal cancer Neg Hx      Current Outpatient Medications:  .  albuterol (VENTOLIN HFA) 108 (90 Base) MCG/ACT inhaler, Inhale 2 puffs into the lungs every 6 (six) hours as needed for wheezing or shortness of breath., Disp: 18 g, Rfl: 1 .  HYDROcodone-homatropine (HYCODAN) 5-1.5 MG/5ML syrup, Take 5 mLs by mouth every 6 (six) hours as needed for up to 10 days for cough., Disp: 180 mL, Rfl:  0 .  levothyroxine (SYNTHROID) 50 MCG tablet, Take 1 tablet (50 mcg total) by mouth daily before breakfast., Disp: 90 tablet, Rfl: 3 .  valACYclovir (VALTREX) 500 MG tablet, Take 1 tablet (500 mg total) by mouth 2 (two) times daily., Disp: 30 tablet, Rfl: prn .  Vitamin D, Ergocalciferol, (DRISDOL) 1.25 MG (50000 UT) CAPS capsule, Take 1 capsule (50,000 Units total) by mouth every 7 (seven) days., Disp: 12 capsule, Rfl: 0   No Known Allergies   Review of Systems  + chest pressure provoked with deep breaths and without breathing deep. It does radiate to her back. + mild SOB. Denies fever, chills, N/V/D, rashes, urinary symptoms, edema. Does not have past hx of abnormal EKG or cardiac conditions. Appetite is low.  Today's Vitals   11/05/19 1911  BP: (!) 160/96  Pulse: 70  Temp: 97.8 F (36.6 C)  SpO2: 98%  Weight: 242 lb 6.4 oz (110 kg)   Body mass index is 35.8 kg/m.   Objective:  Physical Exam   Constitutional: She is oriented to person, place, and time. She appears well-developed and well-nourished. No distress.  HENT:  Head: Normocephalic and atraumatic.  Right Ear: External ear normal.  Left Ear:  External ear normal.  Nose: Nose normal.  Eyes: Conjunctivae are normal. Right eye exhibits no discharge. Left eye exhibits no discharge. No scleral icterus.  Neck: Neck supple. No thyromegaly present.  No carotid bruits bilaterally  Cardiovascular: Normal rate and regular rhythm.  No murmur heard. Pulmonary/Chest: Effort normal and breath sounds normal. No respiratory distress. Unable to provoke her pain with chest palpation.  Musculoskeletal: Normal range of motion. She exhibits no edema.  Lymphadenopathy:    She has no cervical adenopathy.  Neurological: She is alert and oriented to person, place, and time.  Skin: Skin is warm and dry. Capillary refill takes less than 2 seconds. No rash noted. She is not diaphoretic.  Psychiatric: She has a normal mood and affect. Her behavior  is normal. Judgment and thought content normal.  Nursing note reviewed. EXTREM- no edema or calf asymmetry    Assessment And Plan:   1. substernal pressure - EKG 12-Lead- abnormal with non specific T wave changes. She does not have an old one to compare.   2. COVID-19 virus infection- recovering from this  3.- Elevated blood pressure- could be from current symptoms  She was sent to ER for full work up.     Mitsuru Dault RODRIGUEZ-SOUTHWORTH, PA-C    THE PATIENT IS ENCOURAGED TO PRACTICE SOCIAL DISTANCING DUE TO THE COVID-19 PANDEMIC.

## 2019-11-05 NOTE — Discharge Instructions (Addendum)
All your lab work is normal today but your CAT scan of your chest did show pneumonia from Covid.  Continue doing what you are doing.  Rest when you need to.  Make sure you are drinking plenty of fluids and get lots of rest.

## 2019-11-05 NOTE — Telephone Encounter (Signed)
Appointment scheduled for Respiratory clinic at 7:15 pm.  Informed patient that arrival time is 7:00pm.. Directions given

## 2019-11-08 ENCOUNTER — Telehealth: Payer: Self-pay | Admitting: Internal Medicine

## 2019-11-08 NOTE — Telephone Encounter (Signed)
Unfortunately, "regular" antibiotics do not help the covid infection.  I see you have been prescribed an inhaler and cough medicine.  Let us know if you need any other symptomatic medication, and go to the ED for any worsening SOB or diarrhea with possible dehdyration.

## 2019-11-08 NOTE — Telephone Encounter (Signed)
    Patient calling, she wants to know if she should be taking an antibiotic or OTC medication. Patient states she still feels bad, wants to know what she should do. Patient has been seen at Respiratory Clinic and ED Covid positive Pneumonia Nausea dizziness

## 2019-11-09 NOTE — Telephone Encounter (Signed)
My-chart message sent to patient this morning.

## 2019-11-09 NOTE — Telephone Encounter (Signed)
Also called and left VM for patient this morning with info.

## 2019-11-18 ENCOUNTER — Telehealth: Payer: Self-pay

## 2019-11-18 MED ORDER — HYDROCODONE-HOMATROPINE 5-1.5 MG/5ML PO SYRP: 5.0000 mL | ORAL_SOLUTION | Freq: Four times a day (QID) | ORAL | 0 refills | Status: AC | PRN

## 2019-11-18 NOTE — Telephone Encounter (Signed)
Patient calling and would like to know if the albuterol (VENTOLIN HFA) 108 (90 Base) MCG/ACT inhaler That was prescribed is supposed to make your chest tighten? States that it is not every time, but it has happened since using it and just wanted to make sure this is a normal thing for this medication.

## 2019-11-18 NOTE — Telephone Encounter (Signed)
Done erx 

## 2019-11-18 NOTE — Telephone Encounter (Signed)
This would not really be needed, since it is the same medication in just a slightly different form, that wouldn't make any difference in her tolerating it

## 2019-11-18 NOTE — Telephone Encounter (Signed)
Spoke with patient and info given 

## 2019-11-18 NOTE — Telephone Encounter (Signed)
Medication Refill - Medication: HYDROcodone-homatropine (HYCODAN) 5-1.5 MG/5ML syrup   Has the patient contacted their pharmacy? Yes.   (Agent: If no, request that the patient contact the pharmacy for the refill.) (Agent: If yes, when and what did the pharmacy advise?)  Preferred Pharmacy (with phone number or street name): [facility] [place], [place] - [place] [phone]  Agent: Please be advised that RX refills may take up to 3 business days. We ask that you follow-up with your pharmacy.

## 2019-11-18 NOTE — Telephone Encounter (Signed)
Not really supposed to do this, in fact is supposed to make the airways larger so most people feel easier breathing

## 2019-11-19 NOTE — Telephone Encounter (Signed)
Pt informed of same.   Pt is requesting an rx for a nebulizer and albuterol neb solution.   Pt stated that the reason is that the inhaler is difficult for her to use and the neb solution would be easier.   Please advise.

## 2019-11-22 NOTE — Telephone Encounter (Signed)
Patient calling to check status of getting nebulizer and albuterol ned solution sent to pharmacy. Please advise.  [facility] [place], [place] - [place] [phone]

## 2019-11-23 NOTE — Telephone Encounter (Signed)
Very sorry, same answer as before, this is not needed, ok to continue inhaler prn

## 2019-11-23 NOTE — Telephone Encounter (Signed)
Already addressed; I would not feel comfortable that the pt needs this, as there is very little advantage to the nebs over the inhalers if done correctly

## 2019-11-23 NOTE — Telephone Encounter (Signed)
Called patient discussed message with patient, she said  that the inhaler makes her chest tight and that's why she wants a nebulizer and albuterol neb solution.

## 2019-11-24 NOTE — Telephone Encounter (Signed)
Pt contacted and informed of PCP response. Pt is not sure what type of treatment she is suppose to use since she is physically unable to use the inhaler due to decrease in lung capacity from having pneumonia.   Pt stated that maybe she could speak to him directly about her concern and request.

## 2020-03-17 ENCOUNTER — Telehealth: Payer: Self-pay

## 2020-03-17 NOTE — Telephone Encounter (Signed)
Lauren Perkins states she had pneumonia in February and is having some chest pain again. It is in middle of chest, just as it did in February. Feels some pressure.  Feels some tightness pressure intermittently for 2 weeks. Mild cough and a little congestion. Denies SOB or Wheezing.    Advised to call 911 now.  Patient refused 911 outcome and wants to see Dr Jenny Reichmann. Warm transferred to office and spoke with Rhae Lerner; scheduled for 03/21/2020 Lauren patient request. States that she has errands to run on Monday and cannot come in.

## 2020-03-17 NOTE — Telephone Encounter (Signed)
New message    The patient called C/o pneumonia /some chest pain.  The patient is aware the call will be transfer over to Triage [facility] for assessment.  Transfer spoke [place]

## 2020-03-21 ENCOUNTER — Ambulatory Visit: Payer: BC Managed Care – PPO | Admitting: Internal Medicine

## 2020-03-31 ENCOUNTER — Encounter: Payer: Self-pay | Admitting: Internal Medicine

## 2020-03-31 ENCOUNTER — Ambulatory Visit (INDEPENDENT_AMBULATORY_CARE_PROVIDER_SITE_OTHER): Payer: BC Managed Care – PPO | Admitting: Internal Medicine

## 2020-03-31 ENCOUNTER — Other Ambulatory Visit: Payer: Self-pay

## 2020-03-31 VITALS — BP 148/98 | HR 79 | Temp 98.1°F | Ht 69.0 in | Wt 244.0 lb

## 2020-03-31 DIAGNOSIS — R079 Chest pain, unspecified: Secondary | ICD-10-CM

## 2020-03-31 DIAGNOSIS — I10 Essential (primary) hypertension: Secondary | ICD-10-CM | POA: Diagnosis not present

## 2020-03-31 DIAGNOSIS — J4521 Mild intermittent asthma with (acute) exacerbation: Secondary | ICD-10-CM | POA: Diagnosis not present

## 2020-03-31 DIAGNOSIS — E039 Hypothyroidism, unspecified: Secondary | ICD-10-CM

## 2020-03-31 DIAGNOSIS — J45901 Unspecified asthma with (acute) exacerbation: Secondary | ICD-10-CM | POA: Insufficient documentation

## 2020-03-31 DIAGNOSIS — R739 Hyperglycemia, unspecified: Secondary | ICD-10-CM

## 2020-03-31 DIAGNOSIS — J309 Allergic rhinitis, unspecified: Secondary | ICD-10-CM

## 2020-03-31 MED ORDER — PREDNISONE 10 MG PO TABS: ORAL_TABLET | ORAL | 0 refills | Status: DC

## 2020-03-31 NOTE — Progress Notes (Signed)
Subjective:    Patient ID: Lauren Perkins, female    DOB: 1971-01-20, 49 y.o.   MRN: [id number]  HPI  Here with 1.5 wks  mild intermittent chest pain presure like with pulsating and tightness, not sure if allergy related per pt; did remember she had an inhaler and tried it ta bedtime the night before last and all seemed to help;  Pt denies chest pain, orthopnea, PND, increased LE swelling, palpitations, dizziness or syncope.  Pt denies new neurological symptoms such as new headache, or facial or extremity weakness or numbness  Pt denies polydipsia, polyuria  Denies hyper or hypo thyroid symptoms such as voice, skin or hair change.  Did have pneumonia in feb 2021, with COVID infection.   Pt denies fever, wt loss, night sweats, loss of appetite, or other constitutional symptoms  Past Medical History:  Diagnosis Date  . Allergy    seasonal- occ Zyrtec  . Cancer ([place])    atypical hyper plasia left breast   . Fibroids   . Hypothyroidism    Past Surgical History:  Procedure Laterality Date  . BREAST LUMPECTOMY Left [id number]   atipical hyperplasia  . EYE SURGERY     right eye when she was 3    reports that she has never smoked. She has never used smokeless tobacco. She reports that she does not drink alcohol and does not use drugs. family history includes Alcohol abuse in her father; Arthritis in her mother; Breast cancer in her paternal aunt; Cancer in her paternal aunt; Diabetes in her mother; Heart disease in her mother; Hypertension in her mother; Mental illness in her father. No Known Allergies Current Outpatient Medications on File Prior to Visit  Medication Sig Dispense Refill  . albuterol (VENTOLIN HFA) 108 (90 Base) MCG/ACT inhaler Inhale 2 puffs into the lungs every 6 (six) hours as needed for wheezing or shortness of breath. 18 g 1  . Cholecalciferol (VITAMIN D) 50 MCG (2000 UT) tablet Take 2,000 Units by mouth daily.    Marland Kitchen levothyroxine (SYNTHROID) 50 MCG tablet Take 1 tablet  (50 mcg total) by mouth daily before breakfast. 90 tablet 3  . valACYclovir (VALTREX) 500 MG tablet Take 1 tablet (500 mg total) by mouth 2 (two) times daily. 30 tablet prn  . Vitamin D, Ergocalciferol, (DRISDOL) 1.25 MG (50000 UT) CAPS capsule Take 1 capsule (50,000 Units total) by mouth every 7 (seven) days. (Patient not taking: Reported on 03/31/2020) 12 capsule 0   No current facility-administered medications on file prior to visit.   Review of Systems All otherwise neg per pt     Objective:   Physical Exam BP (!) 148/98 (BP Location: Left Arm, Patient Position: Sitting, Cuff Size: Large)   Pulse 79   Temp 98.1 F (36.7 C) (Oral)   Ht 5\' 9"  (1.753 m)   Wt 244 lb (110.7 kg)   SpO2 97%   BMI 36.03 kg/m  VS noted,  Constitutional: Pt appears in NAD HENT: Head: NCAT.  Right Ear: External ear normal.  Left Ear: External ear normal.  Eyes: . Pupils are equal, round, and reactive to light. Conjunctivae and EOM are normal Nose: without d/c or deformity Neck: Neck supple. Gross normal ROM Cardiovascular: Normal rate and regular rhythm.   Pulmonary/Chest: Effort normal and breath sounds without rales or wheezing.  Abd:  Soft, NT, ND, + BS, no organomegaly Neurological: Pt is alert. At baseline orientation, motor grossly intact Skin: Skin is warm. No rashes, other new lesions,  no LE edema Psychiatric: Pt behavior is normal without agitation  All otherwise neg per pt Lab Results  Component Value Date   WBC 4.6 11/05/2019   HGB 13.2 11/05/2019   HCT 39.7 11/05/2019   PLT 249 11/05/2019   GLUCOSE 130 (H) 11/05/2019   CHOL 148 07/14/2019   TRIG 163.0 (H) 07/14/2019   HDL 31.30 (L) 07/14/2019   LDLCALC 84 07/14/2019   ALT 25 11/05/2019   AST 22 11/05/2019   NA 140 11/05/2019   K 3.9 11/05/2019   CL 107 11/05/2019   CREATININE 0.93 11/05/2019   BUN 11 11/05/2019   CO2 21 (L) 11/05/2019   TSH 1.53 07/14/2019   HGBA1C 6.0 07/14/2019   ECG today I have personally interpreted -  NSR 73     Assessment & Plan:

## 2020-03-31 NOTE — Patient Instructions (Signed)
Please take all new medication as prescribed - the prednisone  Please take all new medication as recommended - including allegra and nasacort OTC for allergies  Please continue all other medications as before, including the albuterol inhaler as needed  You will be contacted regarding the referral for: Allergist  Please continue to monitor your blood pressure at home - with the goal being to be at least less than 140/90 most of the time  Please have the pharmacy call with any other refills you may need.  Please continue your efforts at being more active, low cholesterol diet, and weight control.  Please keep your appointments with your specialists as you may have planned

## 2020-04-02 ENCOUNTER — Encounter: Payer: Self-pay | Admitting: Internal Medicine

## 2020-04-02 NOTE — Assessment & Plan Note (Signed)
Mild to mod, for anithist and nasal steroid otc,  to f/u any worsening symptoms or concerns

## 2020-04-02 NOTE — Assessment & Plan Note (Signed)
stable overall by history and exam, recent data reviewed with pt, and pt to continue medical treatment as before,  to f/u any worsening symptoms or concerns  

## 2020-04-02 NOTE — Assessment & Plan Note (Addendum)
Mild to mod, for predpac asd,  to f/u any worsening symptoms or concerns  I spent 41 minutes in preparing to see the patient by review of recent labs, imaging and procedures, obtaining and reviewing separately obtained history, communicating with the patient and family or caregiver, ordering medications, tests or procedures, and documenting clinical information in the EHR including the differential Dx, treatment, and any further evaluation and other management of asthma exacerbation, cp, allergies, htn, hyperglycemia, hypothyroidism

## 2020-04-02 NOTE — Assessment & Plan Note (Signed)
Atypical, etiology unclear, very low suspicion for cardiac

## 2020-04-18 ENCOUNTER — Ambulatory Visit: Payer: BC Managed Care – PPO | Admitting: Allergy and Immunology

## 2020-04-18 ENCOUNTER — Encounter: Payer: Self-pay | Admitting: Allergy and Immunology

## 2020-04-18 ENCOUNTER — Other Ambulatory Visit: Payer: Self-pay

## 2020-04-18 VITALS — BP 112/76 | HR 92 | Temp 98.8°F | Resp 16 | Ht 69.0 in | Wt 243.0 lb

## 2020-04-18 DIAGNOSIS — L2089 Other atopic dermatitis: Secondary | ICD-10-CM | POA: Diagnosis not present

## 2020-04-18 DIAGNOSIS — J4530 Mild persistent asthma, uncomplicated: Secondary | ICD-10-CM

## 2020-04-18 DIAGNOSIS — U071 COVID-19: Secondary | ICD-10-CM | POA: Diagnosis not present

## 2020-04-18 DIAGNOSIS — J984 Other disorders of lung: Secondary | ICD-10-CM | POA: Diagnosis not present

## 2020-04-18 MED ORDER — ARNUITY ELLIPTA 200 MCG/ACT IN AEPB: INHALATION_SPRAY | RESPIRATORY_TRACT | 5 refills | Status: DC

## 2020-04-18 MED ORDER — CLOBETASOL PROPIONATE 0.05 % EX SOLN: CUTANEOUS | 3 refills | Status: DC

## 2020-04-18 MED ORDER — MONTELUKAST SODIUM 10 MG PO TABS: 10.0000 mg | ORAL_TABLET | Freq: Every day | ORAL | 5 refills | Status: DC

## 2020-04-18 NOTE — Progress Notes (Signed)
[place] [facility] [place] [facility] [place] - [place] - [place]   Dear Dr. Jenny Reichmann,  Thank you for referring Lauren Perkins to the [facility] on 04/18/2020.   Below is a summation of this patient's evaluation and recommendations.  Thank you for your referral. I will keep you informed about this patient's response to treatment.   If you have any questions please do not hesitate to contact me.   Sincerely,  Jiles Prows, MD Allergy / Immunology [place]   ______________________________________________________________________    NEW PATIENT NOTE  Referring Provider: Biagio Borg, MD Primary Provider: Biagio Borg, MD Date of office visit: 04/18/2020    Subjective:   Chief Complaint:  Lauren Perkins (DOB: Jan 14, 1971) is a 49 y.o. female who presents to the clinic on 04/18/2020 with a chief complaint of Asthma and Allergic Rhinitis  .     HPI: Lauren Perkins presents to this clinic in evaluation of respiratory tract issues.  Apparently she contracted Covid in February 2021 with pneumonitis manifested as coughing and chest congestion and shortness of breath and sternal chest pain and anosmia.  All that issue appeared to resolve but she has been left with intermittent sternal chest pain and chest tightness.  About 4 weeks ago she required the administration of a systemic steroid for this issue.  She does have a short acting bronchodilator and she has minimal effect from utilizing this agent.  She does have an issue of "sinus allergy" which is very intermittent and requires her to use Zyrtec usually seasonally for a few weeks.  She does have a history of reflux but this apparently only presents itself about 1 time per month.  She does not use any therapy for this condition.  She does have an issue with her scalp.  Apparently she has had very scaly scalp and was given Derma-Smoothe scalp oil  which only made her head more itchy.  This has been a constant problem for a long period in time.  She is tried multiple over-the-counter agents including head and shoulders and Selsun which has not helped this issue.  She has received 1 Pfizer Covid vaccination.  Unfortunately, her [place] Covid vaccination was administered 3 days after completing her systemic steroid dose administered recently.  Past Medical History:  Diagnosis Date  . Allergy    seasonal- occ Zyrtec  . Cancer ([place])    atypical hyper plasia left breast   . Fibroids   . Hypothyroidism     Past Surgical History:  Procedure Laterality Date  . BREAST LUMPECTOMY Left [id number]   atipical hyperplasia  . EYE SURGERY     right eye when she was 3    Allergies as of 04/18/2020   No Known Allergies     Medication List    albuterol 108 (90 Base) MCG/ACT inhaler Commonly known as: VENTOLIN HFA Inhale 2 puffs into the lungs every 6 (six) hours as needed for wheezing or shortness of breath.   levothyroxine 50 MCG tablet Commonly known as: SYNTHROID Take 1 tablet (50 mcg total) by mouth daily before breakfast.   valACYclovir 500 MG tablet Commonly known as: Valtrex Take 1 tablet (500 mg total) by mouth 2 (two) times daily.   Vitamin D 50 MCG (2000 UT) tablet Take 2,000 Units by mouth daily.       Review of systems negative except as noted in HPI / PMHx or noted below:  Review of Systems  Constitutional: Negative.   HENT: Negative.   Eyes: Negative.   Respiratory: Negative.   Cardiovascular: Negative.   Gastrointestinal: Negative.   Genitourinary: Negative.   Musculoskeletal: Negative.   Skin: Negative.   Neurological: Negative.   Endo/Heme/Allergies: Negative.   Psychiatric/Behavioral: Negative.     Family History  Problem Relation Age of Onset  . Arthritis Mother   . Heart disease Mother   . Hypertension Mother   . Diabetes Mother   . Asthma Mother   . Allergic rhinitis Mother   . Alcohol abuse  Father   . Mental illness Father   . Cancer Paternal Aunt        breast  . Breast cancer Paternal Aunt   . Asthma Sister   . Colon cancer Neg Hx   . Colon polyps Neg Hx   . Esophageal cancer Neg Hx   . Stomach cancer Neg Hx   . Rectal cancer Neg Hx     Social History   Socioeconomic History  . Marital status: Legally Separated    Spouse name: Not on file  . Number of children: Not on file  . Years of education: Not on file  . Highest education level: Not on file  Occupational History  . Not on file  Tobacco Use  . Smoking status: Never Smoker  . Smokeless tobacco: Never Used  Vaping Use  . Vaping Use: Never used  Substance and Sexual Activity  . Alcohol use: No    Alcohol/week: 0.0 standard drinks  . Drug use: No  . Sexual activity: Yes    Birth control/protection: None    Comment: NUVA-RING  Other Topics Concern  . Not on file  Social History Narrative  . Not on file    Environmental and Social history  Lives in a townhouse with a dry environment, dog located inside the household, carpet in the bedroom, no plastic on the bed, no plastic on the pillow, no smoking ongoing with inside the household.  She is a professor at [employer].  Objective:   Vitals:   04/18/20 1021  BP: 112/76  Pulse: 92  Resp: 16  Temp: 98.8 F (37.1 C)  SpO2: 96%   Height: 5\' 9"  (175.3 cm) Weight: (!) 243 lb (110.2 kg)  Physical Exam Constitutional:      Appearance: She is not diaphoretic.  HENT:     Head: Normocephalic.     Right Ear: Tympanic membrane, ear canal and external ear normal.     Left Ear: Tympanic membrane, ear canal and external ear normal.     Nose: Nose normal. No mucosal edema or rhinorrhea.     Mouth/Throat:     Pharynx: Uvula midline. No oropharyngeal exudate.  Eyes:     Conjunctiva/sclera: Conjunctivae normal.  Neck:     Thyroid: No thyromegaly.     Trachea: Trachea normal. No tracheal tenderness or tracheal deviation.  Cardiovascular:     Rate and  Rhythm: Normal rate and regular rhythm.     Heart sounds: Normal heart sounds, S1 normal and S2 normal. No murmur heard.   Pulmonary:     Effort: No respiratory distress.     Breath sounds: Normal breath sounds. No stridor. No wheezing or rales.  Lymphadenopathy:     Head:     Right side of head: No tonsillar adenopathy.     Left side of head: No tonsillar adenopathy.     Cervical: No cervical adenopathy.  Skin:    Findings:  Rash (Scalp scale and erythema) present. No erythema.     Nails: There is no clubbing.  Neurological:     Mental Status: She is alert.     Diagnostics: Allergy skin tests were performed. She demonstrated hypersensitivity to house dust mite.  Spirometry was performed and demonstrated an FEV1 of 2.49 @ 90 % of predicted. FEV1/FVC = 0.79.  Following the administration of inhaled albuterol her FEV1 did not improve.  The patient had an Asthma Control Test with the following results: ACT Total Score: 25.    Results of a chest CT angio obtained 05 November 2019 identified the following:  Cardiovascular: Satisfactory opacification the pulmonary arteries to the segmental level. No pulmonary artery filling defects are identified. Central pulmonary arteries are normal caliber. Normal heart size. No pericardial effusion. The aorta is normal caliber. Normal branching of the aortic arch.  Mediastinum/Nodes: Few reactive appearing subcentimeter low-attenuation mediastinal and hilar nodes are present. No pathologically enlarged mediastinal, hilar or axillary adenopathy. No acute abnormality of the trachea or esophagus. Thyroid gland is not visualized. Thoracic inlet is otherwise unremarkable.  Lungs/Pleura: There are multiple sites of consolidative opacity in the lungs including a larger region measuring up 10 3.3 cm in left lung base. As well as opacities in the right upper and lower lobes. No pneumothorax or effusion. There is mild airways thickening.  Results of  blood tests obtained 05 November 2019 identified WBC 4.6, absolute eosinophil 100, absolute lymphocyte 2200, hemoglobin 13.2, platelet 249   Assessment and Plan:    1. Not well controlled mild persistent asthma   2. 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 Other atopic dermatitis     1.  Allergen avoidance measures - dust mite  2.  Treat and prevent inflammation:   A.  Arnuity 200 -1 inhalation 1 time per day  B.  Montelukast 10 mg -1 tablet 1 time per day  3.  If needed:   A.  Albuterol HFA -2 inhalations every 4-6 hours  B.  Cetirizine/Zyrtec 10 mg -1 tablet 1 time per day  C.  Temovate 5.63% scalp application - apply 1-7 times per week  4.  Further evaluation?  5.  Follow through with second [place] vaccination  6.  Plan for fall flu vaccine  7.  Return to clinic in 4 weeks or earlier if problem   Candi appears to have some inflammation of her airway based upon her atopic disease and also possibly from her Covid pneumonitis event back in February 2021. For the next 4 weeks we will have her consistently use anti-inflammatory agents for airway as noted above while she performs avoidance measures directed against dust mite. As well, she appears to have some scalp inflammation and we will try her on Temovate. I will see her back in this clinic in 4 weeks to consider further evaluation and treatment based upon her response to this plan.  Jiles Prows, MD Allergy / Immunology [facility]

## 2020-04-18 NOTE — Patient Instructions (Addendum)
  1.  Allergen avoidance measures - dust mite  2.  Treat and prevent inflammation:   A.  Arnuity 200 -1 inhalation 1 time per day  B.  Montelukast 10 mg -1 tablet 1 time per day  3.  If needed:   A.  Albuterol HFA -2 inhalations every 4-6 hours  B.  Cetirizine/Zyrtec 10 mg -1 tablet 1 time per day  C.  Temovate 9.04% scalp application - apply 1-7 times per week  4.  Further evaluation?  5.  Follow through with second [place] vaccination  6.  Plan for fall flu vaccine  7.  Return to clinic in 4 weeks or earlier if problem

## 2020-04-19 ENCOUNTER — Encounter: Payer: Self-pay | Admitting: Allergy and Immunology

## 2020-04-19 ENCOUNTER — Telehealth: Payer: Self-pay | Admitting: Allergy and Immunology

## 2020-04-19 MED ORDER — ARNUITY ELLIPTA 200 MCG/ACT IN AEPB: INHALATION_SPRAY | RESPIRATORY_TRACT | 5 refills | Status: DC

## 2020-04-19 MED ORDER — MONTELUKAST SODIUM 10 MG PO TABS: 10.0000 mg | ORAL_TABLET | Freq: Every day | ORAL | 5 refills | Status: DC

## 2020-04-19 NOTE — Telephone Encounter (Signed)
Patient called and said that her rx were not called in yesterday and need  Arnuity 200 and Montelukast 10mg . Called into [facility] in [place]. [phone].

## 2020-04-19 NOTE — Telephone Encounter (Signed)
Patient informed that prescriptions were sent to the corrected pharmacy.

## 2020-04-21 ENCOUNTER — Other Ambulatory Visit: Payer: Self-pay

## 2020-04-21 MED ORDER — CLOBETASOL PROPIONATE 0.05 % EX SOLN: CUTANEOUS | 3 refills | Status: DC

## 2020-05-12 ENCOUNTER — Other Ambulatory Visit: Payer: Self-pay | Admitting: Internal Medicine

## 2020-05-12 NOTE — Telephone Encounter (Signed)
Please refill as per office routine med refill policy (all routine meds refilled for 3 mo or monthly per pt preference up to one year from last visit, then month to month grace period for 3 mo, then further med refills will have to be denied)  

## 2020-06-13 ENCOUNTER — Ambulatory Visit: Payer: BC Managed Care – PPO | Admitting: Allergy and Immunology

## 2020-07-14 ENCOUNTER — Other Ambulatory Visit: Payer: Self-pay

## 2020-07-17 ENCOUNTER — Encounter: Payer: Self-pay | Admitting: Internal Medicine

## 2020-07-17 ENCOUNTER — Other Ambulatory Visit: Payer: Self-pay

## 2020-07-17 ENCOUNTER — Ambulatory Visit (INDEPENDENT_AMBULATORY_CARE_PROVIDER_SITE_OTHER): Payer: BC Managed Care – PPO | Admitting: Internal Medicine

## 2020-07-17 VITALS — BP 160/100 | HR 67 | Temp 98.5°F | Ht 69.0 in | Wt 237.0 lb

## 2020-07-17 DIAGNOSIS — R739 Hyperglycemia, unspecified: Secondary | ICD-10-CM | POA: Diagnosis not present

## 2020-07-17 DIAGNOSIS — E559 Vitamin D deficiency, unspecified: Secondary | ICD-10-CM

## 2020-07-17 DIAGNOSIS — J189 Pneumonia, unspecified organism: Secondary | ICD-10-CM | POA: Diagnosis not present

## 2020-07-17 DIAGNOSIS — Z1159 Encounter for screening for other viral diseases: Secondary | ICD-10-CM | POA: Diagnosis not present

## 2020-07-17 DIAGNOSIS — Z0001 Encounter for general adult medical examination with abnormal findings: Secondary | ICD-10-CM

## 2020-07-17 DIAGNOSIS — U071 COVID-19: Secondary | ICD-10-CM

## 2020-07-17 DIAGNOSIS — J188 Other pneumonia, unspecified organism: Secondary | ICD-10-CM | POA: Insufficient documentation

## 2020-07-17 DIAGNOSIS — R9389 Abnormal findings on diagnostic imaging of other specified body structures: Secondary | ICD-10-CM | POA: Diagnosis not present

## 2020-07-17 DIAGNOSIS — Z0000 Encounter for general adult medical examination without abnormal findings: Secondary | ICD-10-CM

## 2020-07-17 DIAGNOSIS — I10 Essential (primary) hypertension: Secondary | ICD-10-CM | POA: Diagnosis not present

## 2020-07-17 LAB — CBC WITH DIFFERENTIAL/PLATELET
Basophils Absolute: 0 10*3/uL (ref 0.0–0.1)
Basophils Relative: 0.6 % (ref 0.0–3.0)
Eosinophils Absolute: 0.1 10*3/uL (ref 0.0–0.7)
Eosinophils Relative: 1.4 % (ref 0.0–5.0)
HCT: 42.1 % (ref 36.0–46.0)
Hemoglobin: 14.3 g/dL (ref 12.0–15.0)
Lymphocytes Relative: 47.4 % — ABNORMAL HIGH (ref 12.0–46.0)
Lymphs Abs: 2.1 10*3/uL (ref 0.7–4.0)
MCHC: 33.9 g/dL (ref 30.0–36.0)
MCV: 92.4 fl (ref 78.0–100.0)
Monocytes Absolute: 0.4 10*3/uL (ref 0.1–1.0)
Monocytes Relative: 8.7 % (ref 3.0–12.0)
Neutro Abs: 1.8 10*3/uL (ref 1.4–7.7)
Neutrophils Relative %: 41.9 % — ABNORMAL LOW (ref 43.0–77.0)
Platelets: 212 10*3/uL (ref 150.0–400.0)
RBC: 4.55 Mil/uL (ref 3.87–5.11)
RDW: 13.4 % (ref 11.5–15.5)
WBC: 4.4 10*3/uL (ref 4.0–10.5)

## 2020-07-17 LAB — BASIC METABOLIC PANEL
BUN: 9 mg/dL (ref 6–23)
CO2: 28 mEq/L (ref 19–32)
Calcium: 9.9 mg/dL (ref 8.4–10.5)
Chloride: 104 mEq/L (ref 96–112)
Creatinine, Ser: 0.93 mg/dL (ref 0.40–1.20)
GFR: 71.38 mL/min (ref >60.00)
Glucose, Bld: 88 mg/dL (ref 70–99)
Potassium: 3.8 mEq/L (ref 3.5–5.1)
Sodium: 140 mEq/L (ref 135–145)

## 2020-07-17 LAB — HEPATIC FUNCTION PANEL
ALT: 22 U/L (ref 0–35)
AST: 18 U/L (ref 0–37)
Albumin: 4.5 g/dL (ref 3.5–5.2)
Alkaline Phosphatase: 60 U/L (ref 39–117)
Bilirubin, Direct: 0 mg/dL (ref 0.0–0.3)
Total Bilirubin: 0.3 mg/dL (ref 0.2–1.2)
Total Protein: 7.6 g/dL (ref 6.0–8.3)

## 2020-07-17 LAB — VITAMIN D 25 HYDROXY (VIT D DEFICIENCY, FRACTURES): VITD: 24.44 ng/mL — ABNORMAL LOW (ref 30.00–100.00)

## 2020-07-17 LAB — HEMOGLOBIN A1C: Hgb A1c MFr Bld: 6.2 % (ref 4.6–6.5)

## 2020-07-17 LAB — TSH: TSH: 2.61 u[IU]/mL (ref 0.35–4.50)

## 2020-07-17 LAB — LIPID PANEL
Cholesterol: 157 mg/dL (ref 0–200)
HDL: 40.3 mg/dL (ref >39.00)
LDL Cholesterol: 101 mg/dL — ABNORMAL HIGH (ref 0–99)
NonHDL: 116.83
Total CHOL/HDL Ratio: 4
Triglycerides: 81 mg/dL (ref 0.0–149.0)
VLDL: 16.2 mg/dL (ref 0.0–40.0)

## 2020-07-17 NOTE — Progress Notes (Signed)
Subjective:    Patient ID: Lauren Perkins, female    DOB: 07-29-1971, 49 y.o.   MRN: [id number]  HPI  Here for wellness and f/u;  Overall doing ok;  Pt denies Chest pain, worsening SOB, DOE, wheezing, orthopnea, PND, worsening LE edema, palpitations, dizziness or syncope.  Pt denies neurological change such as new headache, facial or extremity weakness.  Pt denies polydipsia, polyuria, or low sugar symptoms. Pt states overall good compliance with treatment and medications, good tolerability, and has been trying to follow appropriate diet.  Pt denies worsening depressive symptoms, suicidal ideation or panic. No fever, night sweats, wt loss, loss of appetite, or other constitutional symptoms.  Pt states good ability with ADL's, has low fall risk, home safety reviewed and adequate, no other significant changes in hearing or vision, has been working for 7 wks on an eat smart program.  BP < 140/90 at home, has lsot about 3 lbs with walking 5 days per wk for 2 miles BP Readings from Last 3 Encounters:  07/17/20 (!) 160/100  04/18/20 112/76  03/31/20 (!) 148/98  Has new flovent and singulari for asthma now post covid.  Denies urinary symptoms such as dysuria, frequency, urgency, flank pain, hematuria or n/v, fever, chills.  Pt also asks to f/u recent CT as per recent CT.  No other new complaints Past Medical History:  Diagnosis Date  . Allergy    seasonal- occ Zyrtec  . Cancer ([place])    atypical hyper plasia left breast   . Fibroids   . Hypothyroidism    Past Surgical History:  Procedure Laterality Date  . BREAST LUMPECTOMY Left [id number]   atipical hyperplasia  . EYE SURGERY     right eye when she was 3    reports that she has never smoked. She has never used smokeless tobacco. She reports that she does not drink alcohol and does not use drugs. family history includes Alcohol abuse in her father; Allergic rhinitis in her mother; Arthritis in her mother; Asthma in her mother and sister;  Breast cancer in her paternal aunt; Cancer in her paternal aunt; Diabetes in her mother; Heart disease in her mother; Hypertension in her mother; Mental illness in her father. No Known Allergies Current Outpatient Medications on File Prior to Visit  Medication Sig Dispense Refill  . albuterol (VENTOLIN HFA) 108 (90 Base) MCG/ACT inhaler Inhale 2 puffs into the lungs every 6 (six) hours as needed for wheezing or shortness of breath. 18 g 1  . Cholecalciferol (VITAMIN D) 50 MCG (2000 UT) tablet Take 2,000 Units by mouth daily.    . clobetasol (TEMOVATE) 0.05 % external solution Apply 1-7 times per week 50 mL 3  . EUTHYROX 50 MCG tablet TAKE 1 TABLET BY MOUTH ONCE DAILY BEFORE BREAKFAST 90 tablet 0  . Fluticasone Furoate (ARNUITY ELLIPTA) 200 MCG/ACT AEPB 1 inhalation 1 time per day 30 each 5  . montelukast (SINGULAIR) 10 MG tablet Take 1 tablet (10 mg total) by mouth at bedtime. 30 tablet 5  . valACYclovir (VALTREX) 500 MG tablet Take 1 tablet (500 mg total) by mouth 2 (two) times daily. 30 tablet prn   No current facility-administered medications on file prior to visit.   Review of Systems All otherwise neg per pt    Objective:   Physical Exam BP (!) 160/100 (BP Location: Left Arm, Patient Position: Sitting, Cuff Size: Large)   Pulse 67   Temp 98.5 F (36.9 C) (Oral)   Ht 5\' 9"  (  1.753 m)   Wt 237 lb (107.5 kg)   SpO2 97%   BMI 35.00 kg/m  VS noted,  Constitutional: Pt appears in NAD HENT: Head: NCAT.  Right Ear: External ear normal.  Left Ear: External ear normal.  Eyes: . Pupils are equal, round, and reactive to light. Conjunctivae and EOM are normal Nose: without d/c or deformity Neck: Neck supple. Gross normal ROM Cardiovascular: Normal rate and regular rhythm.   Pulmonary/Chest: Effort normal and breath sounds without rales or wheezing.  Abd:  Soft, NT, ND, + BS, no organomegaly Neurological: Pt is alert. At baseline orientation, motor grossly intact Skin: Skin is warm. No  rashes, other new lesions, no LE edema Psychiatric: Pt behavior is normal without agitation  All otherwise neg per pt Lab Results  Component Value Date   WBC 4.4 07/17/2020   HGB 14.3 07/17/2020   HCT 42.1 07/17/2020   PLT 212.0 07/17/2020   GLUCOSE 88 07/17/2020   CHOL 157 07/17/2020   TRIG 81.0 07/17/2020   HDL 40.30 07/17/2020   LDLCALC 101 (H) 07/17/2020   ALT 22 07/17/2020   AST 18 07/17/2020   NA 140 07/17/2020   K 3.8 07/17/2020   CL 104 07/17/2020   CREATININE 0.93 07/17/2020   BUN 9 07/17/2020   CO2 28 07/17/2020   TSH 2.61 07/17/2020   HGBA1C 6.2 07/17/2020      Assessment & Plan:

## 2020-07-17 NOTE — Patient Instructions (Addendum)
Please remember to have your Phizer booster at 6 months  Please continue all other medications as before, and refills have been done if requested.  Please have the pharmacy call with any other refills you may need.  Please continue your efforts at being more active, low cholesterol diet, and weight control.  You are otherwise up to date with prevention measures today.  Please keep your appointments with your specialists as you may have planned  You will be contacted regarding the referral for: Chest CT  Please go to the LAB at the blood drawing area for the tests to be done  You will be contacted by phone if any changes need to be made immediately.  Otherwise, you will receive a letter about your results with an explanation, but please check with MyChart first.  Please remember to sign up for MyChart if you have not done so, as this will be important to you in the future with finding out test results, communicating by private email, and scheduling acute appointments online when needed.  Please make an Appointment to return for your 1 year visit, or sooner if needed

## 2020-07-18 ENCOUNTER — Encounter: Payer: Self-pay | Admitting: Allergy and Immunology

## 2020-07-18 ENCOUNTER — Ambulatory Visit: Payer: BC Managed Care – PPO | Admitting: Allergy and Immunology

## 2020-07-18 VITALS — BP 130/84 | HR 70 | Temp 98.1°F | Resp 16

## 2020-07-18 DIAGNOSIS — L2089 Other atopic dermatitis: Secondary | ICD-10-CM | POA: Diagnosis not present

## 2020-07-18 DIAGNOSIS — U071 COVID-19: Secondary | ICD-10-CM

## 2020-07-18 DIAGNOSIS — J984 Other disorders of lung: Secondary | ICD-10-CM

## 2020-07-18 DIAGNOSIS — J4530 Mild persistent asthma, uncomplicated: Secondary | ICD-10-CM

## 2020-07-18 LAB — URINALYSIS, ROUTINE W REFLEX MICROSCOPIC
Bilirubin Urine: NEGATIVE
Ketones, ur: NEGATIVE
Leukocytes,Ua: NEGATIVE
Nitrite: NEGATIVE
Specific Gravity, Urine: 1.025 (ref 1.000–1.030)
Total Protein, Urine: NEGATIVE
Urine Glucose: NEGATIVE
Urobilinogen, UA: 0.2 (ref 0.0–1.0)
pH: 6 (ref 5.0–8.0)

## 2020-07-18 LAB — HEPATITIS C ANTIBODY
Hepatitis C Ab: NONREACTIVE
SIGNAL TO CUT-OFF: 0.01 (ref <1.00)

## 2020-07-18 NOTE — Progress Notes (Signed)
- [facility] [place]   Follow-up Note  Referring Provider: Biagio Borg, MD Primary Provider: Biagio Borg, MD Date of Office Visit: 07/18/2020  Subjective:   Lauren Perkins (DOB: [birth year]) is a 49 y.o. female who returns to the [facility] on 07/18/2020 in re-evaluation of the following:  HPI: Lauren Perkins return to clinic in evaluation of Covid infection with pulmonary morbidity, asthma, and atopic dermatitis.  Her last visit to this clinic was her initial evaluation of 18 April 2020.  She is doing much better regarding her respiratory tract.  She has basically resolved almost all of her respiratory tract symptoms and overall just feels a lot better.  She just finds it easier to breathe and she has no exercise-induced bronchospastic symptoms and has no need to use a short acting bronchodilator.  On occasion she does have some ill-defined sternal chest pain but this is not particularly frequent nor intense.  She has not been having any issues with reflux.  She only uses her scalp treatments 1 time per week on the edges of her scalp and at the back of her scalp which works great.  She did receive her second Environmental consultant.  She will not be receiving the flu vaccine.  She will be having a follow-up chest CT scan at some point in next several weeks.  Allergies as of 07/18/2020   No Known Allergies     Medication List      albuterol 108 (90 Base) MCG/ACT inhaler Commonly known as: VENTOLIN HFA Inhale 2 puffs into the lungs every 6 (six) hours as needed for wheezing or shortness of breath.   Arnuity Ellipta 200 MCG/ACT Aepb Generic drug: Fluticasone Furoate 1 inhalation 1 time per day   clobetasol 0.05 % external solution Commonly known as: TEMOVATE Apply 1-7 times per week   Euthyrox 50 MCG tablet Generic drug: levothyroxine TAKE 1 TABLET BY MOUTH ONCE DAILY BEFORE BREAKFAST   montelukast 10 MG  tablet Commonly known as: SINGULAIR Take 1 tablet (10 mg total) by mouth at bedtime.   valACYclovir 500 MG tablet Commonly known as: Valtrex Take 1 tablet (500 mg total) by mouth 2 (two) times daily.   Vitamin D 50 MCG (2000 UT) tablet Take 2,000 Units by mouth daily.       Past Medical History:  Diagnosis Date  . Allergy    seasonal- occ Zyrtec  . Cancer ([place])    atypical hyper plasia left breast   . Fibroids   . Hypothyroidism     Past Surgical History:  Procedure Laterality Date  . BREAST LUMPECTOMY Left [id number]   atipical hyperplasia  . EYE SURGERY     right eye when she was 3    Review of systems negative except as noted in HPI / PMHx or noted below:  Review of Systems  Constitutional: Negative.   HENT: Negative.   Eyes: Negative.   Respiratory: Negative.   Cardiovascular: Negative.   Gastrointestinal: Negative.   Genitourinary: Negative.   Musculoskeletal: Negative.   Skin: Negative.   Neurological: Negative.   Endo/Heme/Allergies: Negative.   Psychiatric/Behavioral: Negative.      Objective:   Vitals:   07/18/20 1620  BP: 130/84  Pulse: 70  Resp: 16  Temp: 98.1 F (36.7 C)  SpO2: 96%          Physical Exam Constitutional:      Appearance: She is not diaphoretic.  HENT:  Head: Normocephalic.     Right Ear: Tympanic membrane, ear canal and external ear normal.     Left Ear: Tympanic membrane, ear canal and external ear normal.     Nose: Nose normal. No mucosal edema or rhinorrhea.     Mouth/Throat:     Pharynx: Uvula midline. No oropharyngeal exudate.  Eyes:     Conjunctiva/sclera: Conjunctivae normal.  Neck:     Thyroid: No thyromegaly.     Trachea: Trachea normal. No tracheal tenderness or tracheal deviation.  Cardiovascular:     Rate and Rhythm: Normal rate and regular rhythm.     Heart sounds: Normal heart sounds, S1 normal and S2 normal. No murmur heard.   Pulmonary:     Effort: No respiratory distress.     Breath  sounds: Normal breath sounds. No stridor. No wheezing or rales.  Lymphadenopathy:     Head:     Right side of head: No tonsillar adenopathy.     Left side of head: No tonsillar adenopathy.     Cervical: No cervical adenopathy.  Skin:    Findings: No erythema or rash.     Nails: There is no clubbing.  Neurological:     Mental Status: She is alert.     Diagnostics:    Spirometry was performed and demonstrated an FEV1 of 2.63 at 95 % of predicted.  Assessment and Plan:   1. Asthma, well controlled, mild persistent   2. 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 Other atopic dermatitis     1.  Allergen avoidance measures - dust mite  2.  Treat and prevent inflammation:   A.  Arnuity 200 -1 inhalation 1 time per day  B.  Montelukast 10 mg -1 tablet 1 time per day  3.  If needed:   A.  Albuterol HFA -2 inhalations every 4-6 hours  B.  Cetirizine/Zyrtec 10 mg -1 tablet 1 time per day  C.  Temovate 3.49% scalp application - apply 1-7 times per week  4.  Return to clinic in January 2022 or earlier if problem   I think Lauren Perkins is doing better and we will keep her on Arnuity and montelukast through all of 2021 and see her back in his clinic in January 2022 and if she continues to do well there may be an opportunity to remove some of her medications.  She will have a follow-up CT scan of her Covid induced inflamed lung findings.  Hopefully that CT scan will be entirely normal.  I will see her back in his clinic in January 2020 or earlier if there is a problem.  Allena Katz, MD Allergy / Immunology [place]

## 2020-07-18 NOTE — Patient Instructions (Signed)
°  1.  Allergen avoidance measures - dust mite  2.  Treat and prevent inflammation:   A.  Arnuity 200 -1 inhalation 1 time per day  B.  Montelukast 10 mg -1 tablet 1 time per day  3.  If needed:   A.  Albuterol HFA -2 inhalations every 4-6 hours  B.  Cetirizine/Zyrtec 10 mg -1 tablet 1 time per day  C.  Temovate 3.31% scalp application - apply 1-7 times per week  4.  Return to clinic in January 2022 or earlier if problem

## 2020-07-19 ENCOUNTER — Encounter: Payer: Self-pay | Admitting: Allergy and Immunology

## 2020-07-19 ENCOUNTER — Encounter: Payer: Self-pay | Admitting: Internal Medicine

## 2020-07-19 NOTE — Assessment & Plan Note (Addendum)
For ct as above  I spent 31 minutes in addition to time for CPX wellness examination in preparing to see the patient by review of recent labs, imaging and procedures, obtaining and reviewing separately obtained history, communicating with the patient and family or caregiver, ordering medications, tests or procedures, and documenting clinical information in the EHR including the differential Dx, treatment, and any further evaluation and other management of abnormal cxr, covid infection, multifocal pna, htn, hyperglycemia, vit d def

## 2020-07-19 NOTE — Assessment & Plan Note (Signed)
Resolved,  to f/u any worsening symptoms or concerns  

## 2020-07-19 NOTE — Assessment & Plan Note (Signed)
stable overall by history and exam, recent data reviewed with pt, and pt to continue medical treatment as before,  to f/u any worsening symptoms or concerns  

## 2020-07-19 NOTE — Assessment & Plan Note (Signed)
For f/u CT as recommended at last CT

## 2020-07-19 NOTE — Assessment & Plan Note (Signed)
Cont oral replacement 

## 2020-07-19 NOTE — Assessment & Plan Note (Signed)

## 2020-07-30 ENCOUNTER — Other Ambulatory Visit: Payer: Self-pay | Admitting: Internal Medicine

## 2020-07-31 ENCOUNTER — Ambulatory Visit
Admission: RE | Admit: 2020-07-31 | Discharge: 2020-07-31 | Disposition: A | Discharge status: Home / Self Care | Payer: BC Managed Care – PPO | Source: Ambulatory Visit | Attending: Internal Medicine | Admitting: Internal Medicine

## 2020-07-31 DIAGNOSIS — J189 Pneumonia, unspecified organism: Secondary | ICD-10-CM

## 2020-07-31 DIAGNOSIS — R9389 Abnormal findings on diagnostic imaging of other specified body structures: Secondary | ICD-10-CM

## 2020-08-01 ENCOUNTER — Encounter: Payer: Self-pay | Admitting: Internal Medicine

## 2020-08-01 ENCOUNTER — Telehealth: Payer: Self-pay | Admitting: Internal Medicine

## 2020-08-01 MED ORDER — SULFAMETHOXAZOLE-TRIMETHOPRIM 800-160 MG PO TABS: 1.0000 | ORAL_TABLET | Freq: Two times a day (BID) | ORAL | 0 refills | Status: DC

## 2020-08-01 NOTE — Telephone Encounter (Signed)
   Patient made aware medication called to pharmacy

## 2020-08-01 NOTE — Telephone Encounter (Signed)
Sent to [facility] to advise. °

## 2020-08-01 NOTE — Telephone Encounter (Signed)
I will send in an antibiotic since she just saw him. If the symptoms persist, she will need to make another in office visit for re-check.  I sent to pharmacy in [place]. If it is too far for her to drive and the symptoms do persist, she can go to an U/C down there for re-check also.

## 2020-08-01 NOTE — Telephone Encounter (Signed)
° °  Patient calling to report painful, frequent urination. Last UA was 10/25 Patient requesting medication be prescribed.  Please advise

## 2020-08-11 ENCOUNTER — Telehealth: Payer: Self-pay | Admitting: Internal Medicine

## 2020-08-11 NOTE — Telephone Encounter (Signed)
Please call to discuss lab results 

## 2020-08-14 NOTE — Telephone Encounter (Signed)
LVM instructing pt to call back

## 2020-08-28 ENCOUNTER — Ambulatory Visit: Payer: BC Managed Care – PPO | Admitting: Internal Medicine

## 2020-10-17 ENCOUNTER — Ambulatory Visit: Payer: BC Managed Care – PPO | Admitting: Allergy and Immunology

## 2020-12-04 ENCOUNTER — Telehealth: Payer: Self-pay | Admitting: Internal Medicine

## 2020-12-04 ENCOUNTER — Other Ambulatory Visit: Payer: Self-pay

## 2020-12-04 MED ORDER — LEVOTHYROXINE SODIUM 50 MCG PO TABS
50.0000 ug | ORAL_TABLET | Freq: Every day | ORAL | 0 refills | Status: DC
Start: 2020-12-04 — End: 2020-12-04

## 2020-12-04 MED ORDER — LEVOTHYROXINE SODIUM 50 MCG PO TABS
50.0000 ug | ORAL_TABLET | Freq: Every day | ORAL | 0 refills | Status: DC
Start: 2020-12-04 — End: 2021-03-07

## 2020-12-04 NOTE — Telephone Encounter (Signed)
Please refill as per office routine med refill policy (all routine meds refilled for 3 mo or monthly per pt preference up to one year from last visit, then month to month grace period for 3 mo, then further med refills will have to be denied)  

## 2020-12-04 NOTE — Telephone Encounter (Signed)
Patient is requesting that EUTHYROX 50 MCG tablet    be sent to [place], [place]

## 2020-12-04 NOTE — Telephone Encounter (Signed)
EUTHYROX 50 MCG tablet [place], [place] Phone:  [phone]  Fax:  [phone]     Last seen- 10.25.21 Next apt- n/a

## 2020-12-05 ENCOUNTER — Ambulatory Visit: Payer: Self-pay | Admitting: Allergy and Immunology

## 2021-01-23 ENCOUNTER — Ambulatory Visit: Payer: Self-pay | Admitting: Allergy and Immunology

## 2021-03-07 ENCOUNTER — Telehealth: Payer: Self-pay | Admitting: Internal Medicine

## 2021-03-07 ENCOUNTER — Other Ambulatory Visit: Payer: Self-pay

## 2021-03-07 MED ORDER — LEVOTHYROXINE SODIUM 50 MCG PO TABS
50.0000 ug | ORAL_TABLET | Freq: Every day | ORAL | 0 refills | Status: DC
Start: 2021-03-07 — End: 2021-09-07

## 2021-03-07 NOTE — Telephone Encounter (Signed)
    Patient is requesting refill for levothyroxine (EUTHYROX) [facility] [address] [place] [phone]

## 2021-07-06 IMAGING — CT CT CHEST W/O CM
1 series · 15 of 34 positions shown, 19 images · non-contrast
Comparison: Chest CTA 11/05/2019, no interval exams available.

CLINICAL DATA: Abnormal chest x-ray. Multifocal pneumonia.
Respiratory illness, nondiagnostic x-ray. Patient reports chest and
back pain for weeks. Intermittent cough.

EXAM:
CT CHEST WITHOUT CONTRAST
TECHNIQUE: Multidetector CT imaging of the chest was performed following the
standard protocol without IV contrast.

[Series 2: chest w/(date) · axial · 0.83mm/px · z∈[-318,-26]mm · 15 of 172 slices shown, 19 images]
[im 13/172  mediastinal]
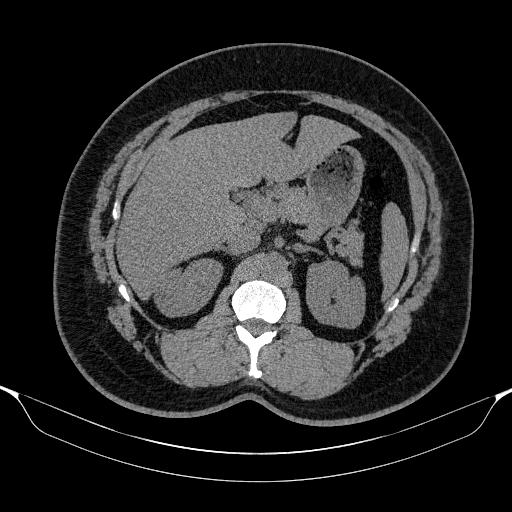
[im 13/172  lung]
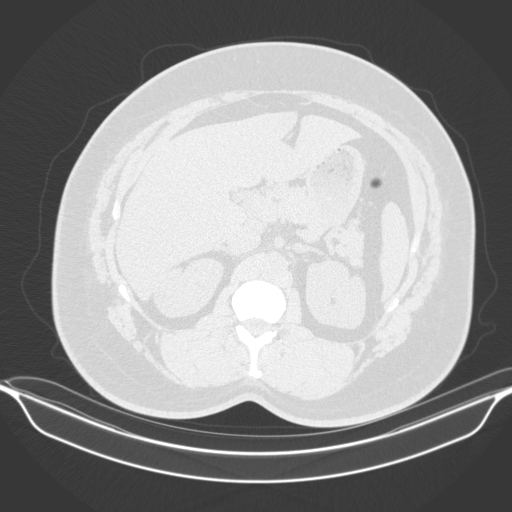
[im 26/172  lung]
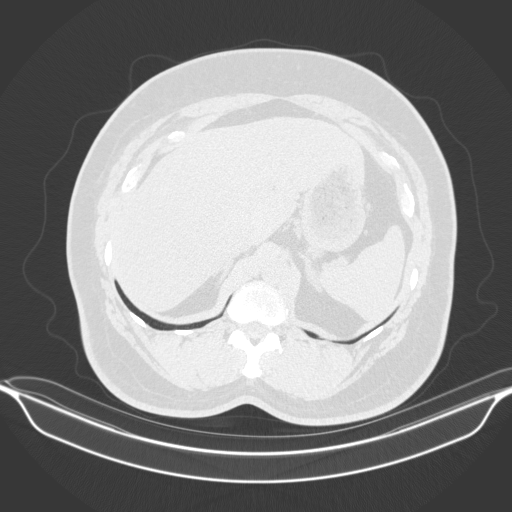
[im 35/172  lung]
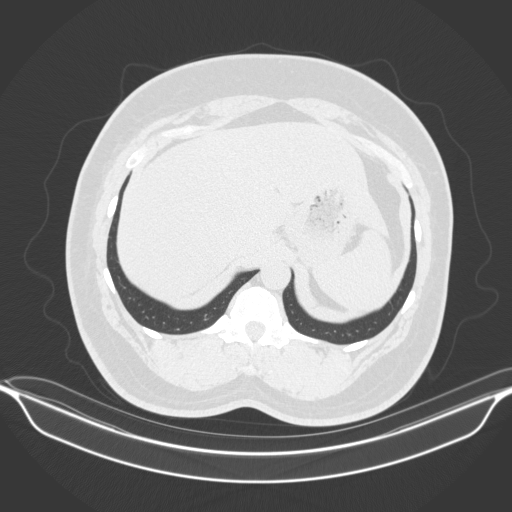
[im 45/172  lung]
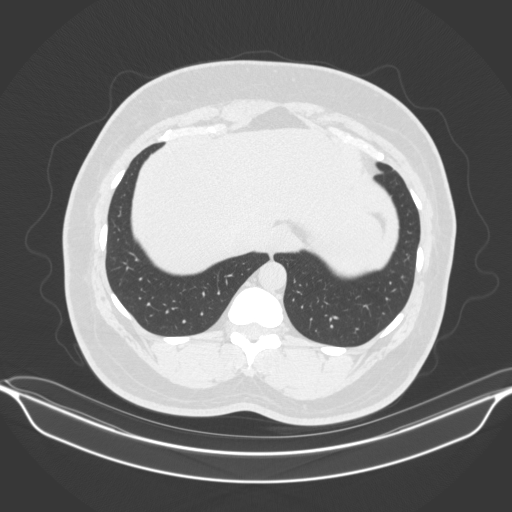
[im 58/172  mediastinal]
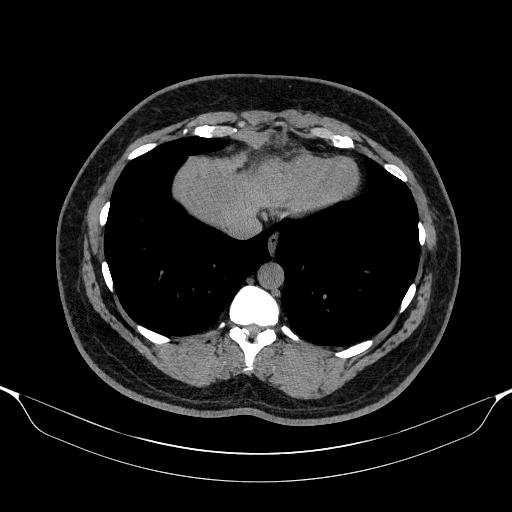
[im 58/172  lung]
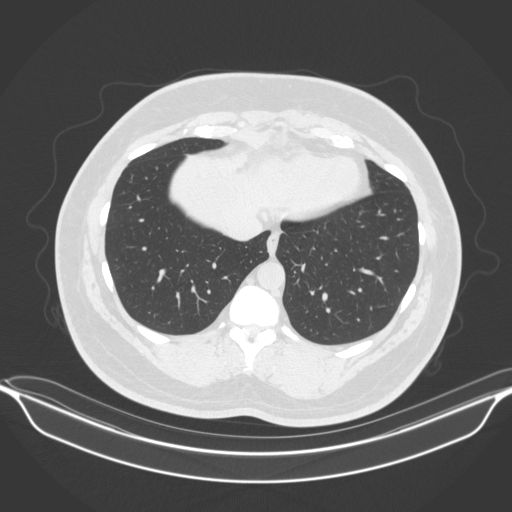
[im 69/172  lung]
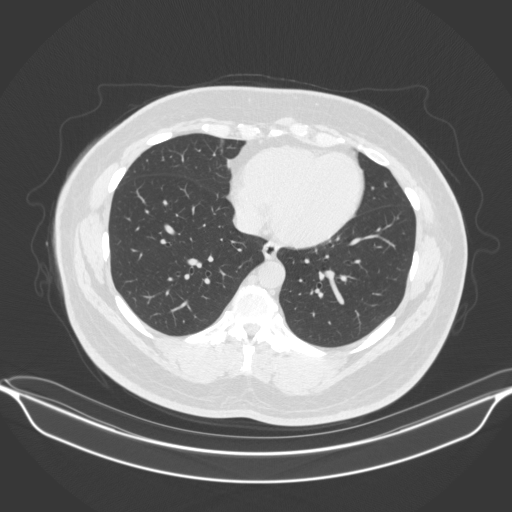
[im 77/172  lung]
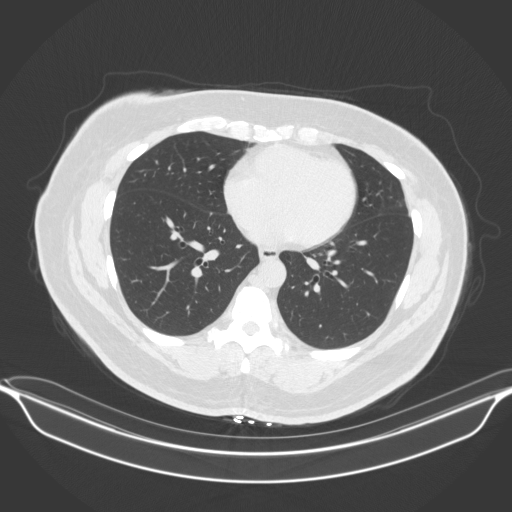
[im 89/172  lung]
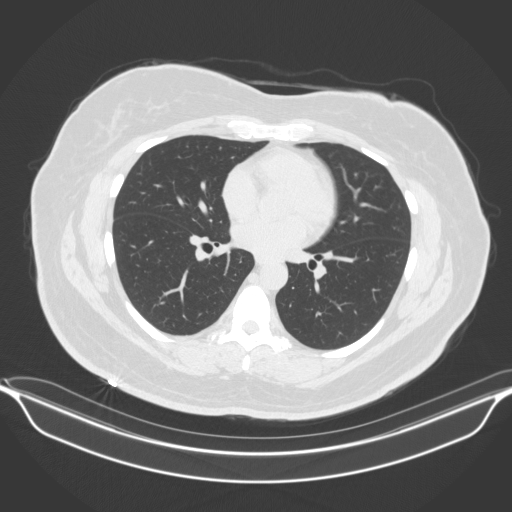
[im 96/172  mediastinal]
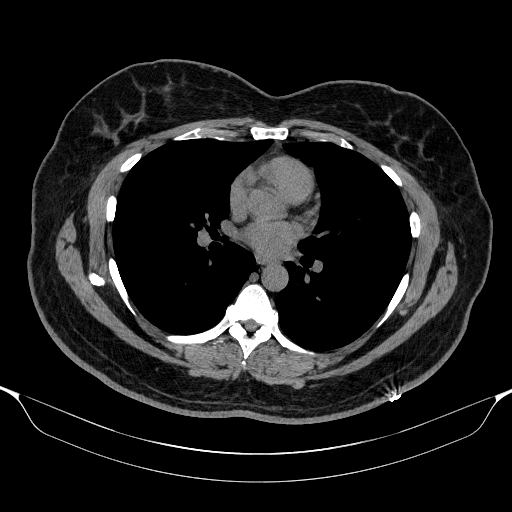
[im 96/172  lung]
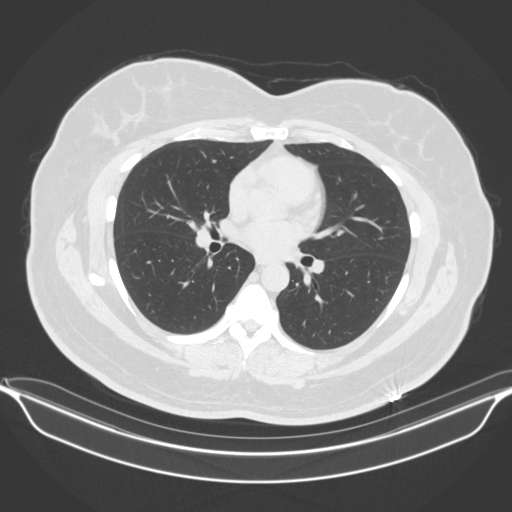
[im 103/172  lung]
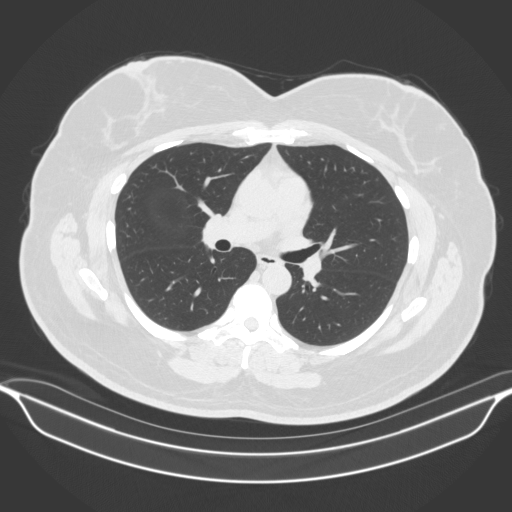
[im 115/172  lung]
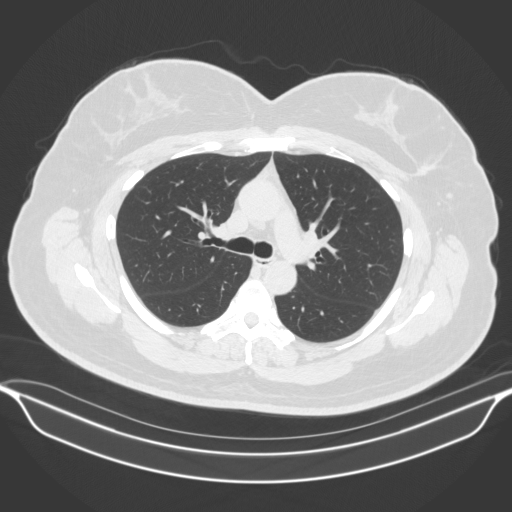
[im 127/172  lung]
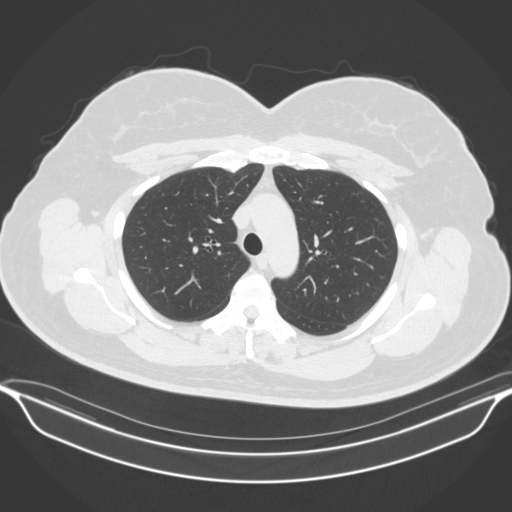
[im 137/172  mediastinal]
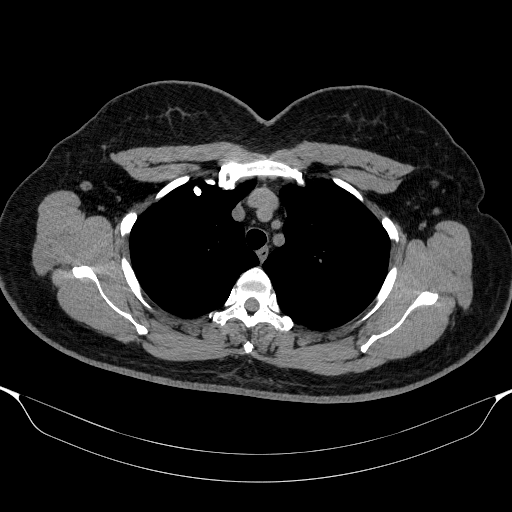
[im 137/172  lung]
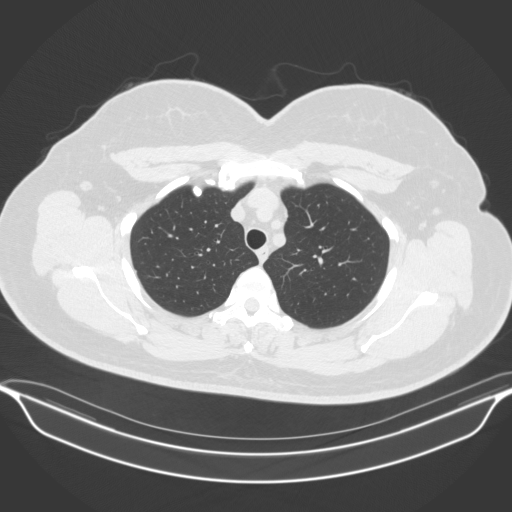
[im 146/172  lung]
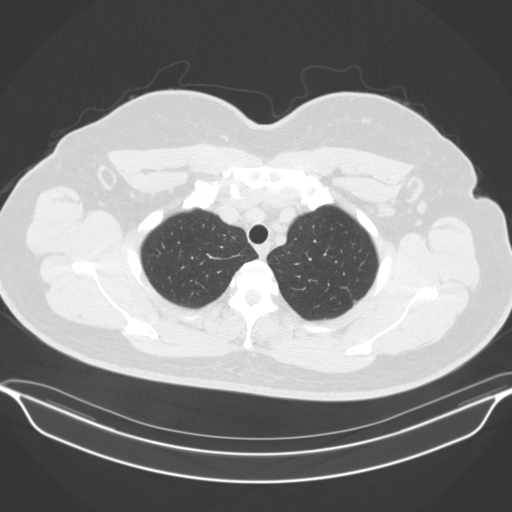
[im 159/172  lung]
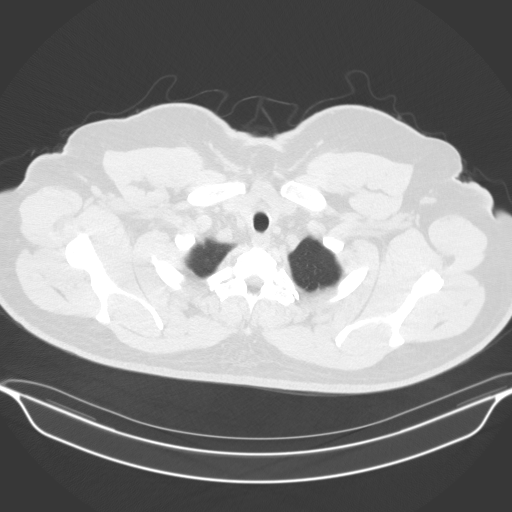

[15 of 34 positions shown; findings below may reference images not displayed]

FINDINGS: Cardiovascular: The thoracic aorta is normal in caliber. Heart is
normal in size. No pericardial fluid.

Mediastinum/Nodes: No enlarged mediastinal lymph nodes. No bulky
hilar adenopathy, detailed hilar assessment is limited in the
absence of IV contrast. No suspicious axillary adenopathy. No
thyroid nodule. Esophagus is unremarkable.

Lungs/Pleura: The previous bilateral rounded consolidative opacities
have resolved from prior exam. No evidence of underlying malignancy.
In the periphery of the right lower lobe there is a 6 x 3 mm nodule
(5 mm mean diameter) on image 122 of series 5. This was not
definitively seen on prior exam. No other pulmonary nodules. No
pleural fluid. No findings of pulmonary edema. The trachea and
central bronchi are patent.

Upper Abdomen: No acute findings. Low-density lesion in the upper
right kidney corresponds to a cyst on 09/03/2018 abdominal CT.

Musculoskeletal: There are no acute or suspicious osseous
abnormalities. Chest wall soft tissues are non suspicious. Surgical
clips in the left breast.
IMPRESSION: 1. Previous bilateral rounded consolidative opacities have resolved
from prior exam. No evidence of underlying malignancy.
2. A 5 mm nodule in the periphery of the right lower lobe was not
definitively seen on prior exam. This is likely post infectious or
inflammatory, however recommend follow-up CT in 12 months to ensure
resolution.

## 2021-08-23 ENCOUNTER — Encounter: Payer: BC Managed Care – PPO | Admitting: Internal Medicine

## 2021-08-23 ENCOUNTER — Ambulatory Visit: Payer: BC Managed Care – PPO | Admitting: Internal Medicine

## 2021-09-07 ENCOUNTER — Ambulatory Visit (INDEPENDENT_AMBULATORY_CARE_PROVIDER_SITE_OTHER): Payer: BC Managed Care – PPO | Admitting: Internal Medicine

## 2021-09-07 ENCOUNTER — Other Ambulatory Visit: Payer: Self-pay

## 2021-09-07 ENCOUNTER — Encounter: Payer: Self-pay | Admitting: Internal Medicine

## 2021-09-07 VITALS — BP 146/98 | HR 67 | Temp 98.0°F | Ht 69.0 in | Wt 250.0 lb

## 2021-09-07 DIAGNOSIS — Z01419 Encounter for gynecological examination (general) (routine) without abnormal findings: Secondary | ICD-10-CM

## 2021-09-07 DIAGNOSIS — Z0001 Encounter for general adult medical examination with abnormal findings: Secondary | ICD-10-CM | POA: Diagnosis not present

## 2021-09-07 DIAGNOSIS — E669 Obesity, unspecified: Secondary | ICD-10-CM

## 2021-09-07 DIAGNOSIS — F418 Other specified anxiety disorders: Secondary | ICD-10-CM

## 2021-09-07 DIAGNOSIS — Z6834 Body mass index (BMI) 34.0-34.9, adult: Secondary | ICD-10-CM

## 2021-09-07 DIAGNOSIS — E559 Vitamin D deficiency, unspecified: Secondary | ICD-10-CM

## 2021-09-07 DIAGNOSIS — A6000 Herpesviral infection of urogenital system, unspecified: Secondary | ICD-10-CM | POA: Diagnosis not present

## 2021-09-07 DIAGNOSIS — E7800 Pure hypercholesterolemia, unspecified: Secondary | ICD-10-CM

## 2021-09-07 DIAGNOSIS — E538 Deficiency of other specified B group vitamins: Secondary | ICD-10-CM

## 2021-09-07 DIAGNOSIS — E039 Hypothyroidism, unspecified: Secondary | ICD-10-CM

## 2021-09-07 DIAGNOSIS — I10 Essential (primary) hypertension: Secondary | ICD-10-CM | POA: Diagnosis not present

## 2021-09-07 DIAGNOSIS — R739 Hyperglycemia, unspecified: Secondary | ICD-10-CM

## 2021-09-07 MED ORDER — LEVOTHYROXINE SODIUM 50 MCG PO TABS
50.0000 ug | ORAL_TABLET | Freq: Every day | ORAL | 3 refills | Status: DC
Start: 2021-09-07 — End: 2022-10-03

## 2021-09-07 MED ORDER — VITAMIN D 50 MCG (2000 UT) PO TABS: 2000.0000 [IU] | ORAL_TABLET | Freq: Every day | ORAL | 99 refills | Status: AC

## 2021-09-07 MED ORDER — VALACYCLOVIR HCL 500 MG PO TABS: 500.0000 mg | ORAL_TABLET | Freq: Two times a day (BID) | ORAL | 11 refills | Status: DC

## 2021-09-07 NOTE — Assessment & Plan Note (Signed)
Last vitamin D Lab Results  Component Value Date   VD25OH 24.44 (L) 07/17/2020   Low, to start oral replacement

## 2021-09-07 NOTE — Progress Notes (Signed)
Patient ID: Lauren Perkins, female   DOB: 1970/10/12, 50 y.o.   MRN: [id number]         Chief Complaint:: wellness exam and obesity, low vit d, depression, htn       HPI:  Lauren Perkins is a 50 y.o. female here for wellness exam; declines covid vax shingrix, flu, pneumovax, but ok for GYN referral - did not like last one. Ow up to date                        Also unable to lose significant wt despite increased diet and activity.  Not taking Vit D.  Gas had mild worsening depressive symptoms, but no suicidal ideation, or panic; has ongoing anxiety as well, and declines any specific tx such as SSRI or referral for counseling for now.  BP has been < 140/90 at home this past yr but not checked recently, but willing to do so.  Has seen urology for microhemauturia with reportedly negative CT eval and and cystoscopy.   Pt denies chest pain, increased sob or doe, wheezing, orthopnea, PND, increased LE swelling, palpitations, dizziness or syncope.   Pt denies polydipsia, polyuria, or new focal neuro s/s.   Pt denies fever, wt loss, night sweats, loss of appetite, or other constitutional symptoms  No other new complaints     Wt Readings from Last 3 Encounters:  09/07/21 250 lb (113.4 kg)  07/17/20 237 lb (107.5 kg)  04/18/20 (!) 243 lb (110.2 kg)   BP Readings from Last 3 Encounters:  09/07/21 (!) 146/98  07/18/20 130/84  07/17/20 (!) 160/100   Immunization History  Administered Date(s) Administered   PFIZER(Purple Top)SARS-COV-2 Vaccination 04/12/2020, 05/05/2020   Tdap 06/14/2015   There are no preventive care reminders to display for this patient.     Past Medical History:  Diagnosis Date   Allergy    seasonal- occ Zyrtec   Cancer ([place])    atypical hyper plasia left breast    Fibroids    Hypothyroidism    Past Surgical History:  Procedure Laterality Date   BREAST LUMPECTOMY Left [id number]   atipical hyperplasia   EYE SURGERY     right eye when she was 3    reports that  she has never smoked. She has never used smokeless tobacco. She reports that she does not drink alcohol and does not use drugs. family history includes Alcohol abuse in her father; Allergic rhinitis in her mother; Arthritis in her mother; Asthma in her mother and sister; Breast cancer in her paternal aunt; Cancer in her paternal aunt; Diabetes in her mother; Heart disease in her mother; Hypertension in her mother; Mental illness in her father. No Known Allergies Current Outpatient Medications on File Prior to Visit  Medication Sig Dispense Refill   albuterol (VENTOLIN HFA) 108 (90 Base) MCG/ACT inhaler Inhale 2 puffs into the lungs every 6 (six) hours as needed for wheezing or shortness of breath. 18 g 1   clobetasol (TEMOVATE) 0.05 % external solution Apply 1-7 times per week 50 mL 3   Fluticasone Furoate (ARNUITY ELLIPTA) 200 MCG/ACT AEPB 1 inhalation 1 time per day 30 each 5   montelukast (SINGULAIR) 10 MG tablet Take 1 tablet (10 mg total) by mouth at bedtime. 30 tablet 5   sulfamethoxazole-trimethoprim (BACTRIM DS) 800-160 MG tablet Take 1 tablet by mouth 2 (two) times daily. 10 tablet 0   No current facility-administered medications on file prior to visit.  ROS:  All others reviewed and negative.  Objective        PE:  BP (!) 146/98    Pulse 67    Temp 98 F (36.7 C) (Oral)    Ht 5\' 9"  (1.753 m)    Wt 250 lb (113.4 kg)    SpO2 98%    BMI 36.92 kg/m                 Constitutional: Pt appears in NAD               HENT: Head: NCAT.                Right Ear: External ear normal.                 Left Ear: External ear normal.                Eyes: . Pupils are equal, round, and reactive to light. Conjunctivae and EOM are normal               Nose: without d/c or deformity               Neck: Neck supple. Gross normal ROM               Cardiovascular: Normal rate and regular rhythm.                 Pulmonary/Chest: Effort normal and breath sounds without rales or wheezing.                 Abd:  Soft, NT, ND, + BS, no organomegaly               Neurological: Pt is alert. At baseline orientation, motor grossly intact               Skin: Skin is warm. No rashes, no other new lesions, LE edema - none               Psychiatric: Pt behavior is normal without agitation   Micro: none  Cardiac tracings I have personally interpreted today:  none  Pertinent Radiological findings (summarize): none   Lab Results  Component Value Date   WBC 5.1 09/07/2021   HGB 13.8 09/07/2021   HCT 40.1 09/07/2021   PLT 196 09/07/2021   GLUCOSE 92 09/07/2021   CHOL 159 09/07/2021   TRIG 141 09/07/2021   HDL 34 (L) 09/07/2021   LDLCALC 101 (H) 09/07/2021   ALT 18 09/07/2021   AST 15 09/07/2021   NA 138 09/07/2021   K 4.3 09/07/2021   CL 104 09/07/2021   CREATININE 0.81 09/07/2021   BUN 13 09/07/2021   CO2 24 09/07/2021   TSH 2.92 09/07/2021   HGBA1C 6.3 (H) 09/07/2021   Assessment/Plan:  Lauren Perkins is a 50 y.o. Black or African American [2] female with  has a past medical history of Allergy, Cancer ([place]), Fibroids, and Hypothyroidism.  Vitamin D deficiency Last vitamin D Lab Results  Component Value Date   VD25OH 24.44 (L) 07/17/2020   Low, to start oral replacement   Encounter for well adult exam with abnormal findings Age and sex appropriate education and counseling updated with regular exercise and diet Referrals for preventative services - for GYN referral for pap/mammogram Immunizations addressed - declines covid, flu, shingrix, pneumovax Smoking counseling  - none needed Evidence for depression or other mood disorder - + mild depression, declines tx such as SSRI  or counseling Most recent labs reviewed. I have personally reviewed and have noted: 1) the patient's medical and social history 2) The patient's current medications and supplements 3) The patient's height, weight, and BMI have been recorded in the chart   Hypothyroidism Lab Results  Component  Value Date   TSH 2.92 09/07/2021   Stable, pt to continue levothyroxine   Essential hypertension BP Readings from Last 3 Encounters:  09/07/21 (!) 146/98  07/18/20 130/84  07/17/20 (!) 160/100   Mild uncontrolled, pt to continue medical treatment - diet, low salt, excercse, wt control  - declines med tx and will check BP at home once daily for 10 days and report average  Hyperglycemia Lab Results  Component Value Date   HGBA1C 6.3 (H) 09/07/2021   Stable, pt to continue current medical treatment   - diet, wt control   HLD (hyperlipidemia) Lab Results  Component Value Date   LDLCALC 101 (H) 09/07/2021   Mild uncontrolled, goal < 100, pt to continue current low chol diet, declines statin for now  Depression with anxiety Mild uncontrolled, declines specific tx for now such as SSRI or counseling referral  Obesity Uncontrolled worsening, for amb referral to medical wt management - ? monjouro candidate  Followup: Return in about 1 year (around 09/07/2022).  [name], MD 09/08/2021 6:24 AM [facility]

## 2021-09-07 NOTE — Patient Instructions (Addendum)
Please take OTC Vitamin D3 at 2000 units per day, indefinitely  Please call if you feel you need medication for depression, and consider the work counseling  Please check your BP once per day for 10 days and call with the average if over 140/90  Please continue all other medications as before, and refills have been done if requested.  Please have the pharmacy call with any other refills you may need.  Please continue your efforts at being more active, low cholesterol diet, and weight control.  You are otherwise up to date with prevention measures today.  Please keep your appointments with your specialists as you may have planned  You will be contacted regarding the referral for: GYN [facility], and the Weight Management clinic  Please consider making appt with Sports Medicine for the right achilles and lower back pain  Please go to the LAB at the blood drawing area for the tests to be done  You will be contacted by phone if any changes need to be made immediately.  Otherwise, you will receive a letter about your results with an explanation, but please check with MyChart first.  Please remember to sign up for MyChart if you have not done so, as this will be important to you in the future with finding out test results, communicating by private email, and scheduling acute appointments online when needed.  Please make an Appointment to return for your 1 year visit, or sooner if needed

## 2021-09-08 ENCOUNTER — Encounter: Payer: Self-pay | Admitting: Internal Medicine

## 2021-09-08 DIAGNOSIS — E785 Hyperlipidemia, unspecified: Secondary | ICD-10-CM | POA: Insufficient documentation

## 2021-09-08 DIAGNOSIS — F418 Other specified anxiety disorders: Secondary | ICD-10-CM | POA: Insufficient documentation

## 2021-09-08 LAB — URINALYSIS, ROUTINE W REFLEX MICROSCOPIC
Bacteria, UA: NONE SEEN /HPF
Bilirubin Urine: NEGATIVE
Glucose, UA: NEGATIVE
Hyaline Cast: NONE SEEN /LPF
Ketones, ur: NEGATIVE
Leukocytes,Ua: NEGATIVE
Nitrite: NEGATIVE
Protein, ur: NEGATIVE
Specific Gravity, Urine: 1.013 (ref 1.001–1.035)
WBC, UA: NONE SEEN /HPF (ref 0–5)
pH: 7 (ref 5.0–8.0)

## 2021-09-08 LAB — CBC WITH DIFFERENTIAL/PLATELET
Absolute Monocytes: 449 cells/uL (ref 200–950)
Basophils Absolute: 31 cells/uL (ref 0–200)
Basophils Relative: 0.6 %
Eosinophils Absolute: 82 cells/uL (ref 15–500)
Eosinophils Relative: 1.6 %
HCT: 40.1 % (ref 35.0–45.0)
Hemoglobin: 13.8 g/dL (ref 11.7–15.5)
Lymphs Abs: 2458 cells/uL (ref 850–3900)
MCH: 31.4 pg (ref 27.0–33.0)
MCHC: 34.4 g/dL (ref 32.0–36.0)
MCV: 91.3 fL (ref 80.0–100.0)
MPV: 11.7 fL (ref 7.5–12.5)
Monocytes Relative: 8.8 %
Neutro Abs: 2081 cells/uL (ref 1500–7800)
Neutrophils Relative %: 40.8 %
Platelets: 196 10*3/uL (ref 140–400)
RBC: 4.39 10*6/uL (ref 3.80–5.10)
RDW: 12.9 % (ref 11.0–15.0)
Total Lymphocyte: 48.2 %
WBC: 5.1 10*3/uL (ref 3.8–10.8)

## 2021-09-08 LAB — BASIC METABOLIC PANEL
BUN: 13 mg/dL (ref 7–25)
CO2: 24 mmol/L (ref 20–32)
Calcium: 9.4 mg/dL (ref 8.6–10.4)
Chloride: 104 mmol/L (ref 98–110)
Creat: 0.81 mg/dL (ref 0.50–1.03)
Glucose, Bld: 92 mg/dL (ref 65–99)
Potassium: 4.3 mmol/L (ref 3.5–5.3)
Sodium: 138 mmol/L (ref 135–146)

## 2021-09-08 LAB — HEPATIC FUNCTION PANEL
AG Ratio: 1.4 (calc) (ref 1.0–2.5)
ALT: 18 U/L (ref 6–29)
AST: 15 U/L (ref 10–35)
Albumin: 4.3 g/dL (ref 3.6–5.1)
Alkaline phosphatase (APISO): 56 U/L (ref 37–153)
Bilirubin, Direct: 0.1 mg/dL (ref 0.0–0.2)
Globulin: 3 g/dL (calc) (ref 1.9–3.7)
Indirect Bilirubin: 0.1 mg/dL (calc) — ABNORMAL LOW (ref 0.2–1.2)
Total Bilirubin: 0.2 mg/dL (ref 0.2–1.2)
Total Protein: 7.3 g/dL (ref 6.1–8.1)

## 2021-09-08 LAB — HEMOGLOBIN A1C
Hgb A1c MFr Bld: 6.3 % of total Hgb — ABNORMAL HIGH (ref <5.7)
Mean Plasma Glucose: 134 mg/dL
eAG (mmol/L): 7.4 mmol/L

## 2021-09-08 LAB — LIPID PANEL
Cholesterol: 159 mg/dL (ref <200)
HDL: 34 mg/dL — ABNORMAL LOW (ref >=50)
LDL Cholesterol (Calc): 101 mg/dL (calc) — ABNORMAL HIGH
Non-HDL Cholesterol (Calc): 125 mg/dL (calc) (ref <130)
Total CHOL/HDL Ratio: 4.7 (calc) (ref <5.0)
Triglycerides: 141 mg/dL (ref <150)

## 2021-09-08 LAB — TSH: TSH: 2.92 mIU/L

## 2021-09-08 LAB — VITAMIN D 25 HYDROXY (VIT D DEFICIENCY, FRACTURES): Vit D, 25-Hydroxy: 28 ng/mL — ABNORMAL LOW (ref 30–100)

## 2021-09-08 LAB — VITAMIN B12: Vitamin B-12: 400 pg/mL (ref 200–1100)

## 2021-09-08 LAB — T4, FREE: Free T4: 0.8 ng/dL (ref 0.8–1.8)

## 2021-09-08 NOTE — Assessment & Plan Note (Signed)
Lab Results  Component Value Date   HGBA1C 6.3 (H) 09/07/2021   Stable, pt to continue current medical treatment   - diet, wt control

## 2021-09-08 NOTE — Assessment & Plan Note (Signed)
Uncontrolled worsening, for amb referral to medical wt management - ? monjouro candidate

## 2021-09-08 NOTE — Assessment & Plan Note (Signed)
BP Readings from Last 3 Encounters:  09/07/21 (!) 146/98  07/18/20 130/84  07/17/20 (!) 160/100   Mild uncontrolled, pt to continue medical treatment - diet, low salt, excercse, wt control  - declines med tx and will check BP at home once daily for 10 days and report average

## 2021-09-08 NOTE — Assessment & Plan Note (Signed)
Age and sex appropriate education and counseling updated with regular exercise and diet Referrals for preventative services - for GYN referral for pap/mammogram Immunizations addressed - declines covid, flu, shingrix, pneumovax Smoking counseling  - none needed Evidence for depression or other mood disorder - + mild depression, declines tx such as SSRI or counseling Most recent labs reviewed. I have personally reviewed and have noted: 1) the patient's medical and social history 2) The patient's current medications and supplements 3) The patient's height, weight, and BMI have been recorded in the chart

## 2021-09-08 NOTE — Assessment & Plan Note (Signed)
Mild uncontrolled, declines specific tx for now such as SSRI or counseling referral

## 2021-09-08 NOTE — Assessment & Plan Note (Signed)
Lab Results  Component Value Date   TSH 2.92 09/07/2021   Stable, pt to continue levothyroxine

## 2021-09-08 NOTE — Assessment & Plan Note (Signed)
Lab Results  Component Value Date   LDLCALC 101 (H) 09/07/2021   Mild uncontrolled, goal < 100, pt to continue current low chol diet, declines statin for now

## 2022-01-28 ENCOUNTER — Other Ambulatory Visit: Payer: Self-pay | Admitting: Internal Medicine

## 2022-01-28 ENCOUNTER — Ambulatory Visit (INDEPENDENT_AMBULATORY_CARE_PROVIDER_SITE_OTHER): Payer: BC Managed Care – PPO | Admitting: Internal Medicine

## 2022-01-28 ENCOUNTER — Telehealth: Payer: Self-pay | Admitting: Internal Medicine

## 2022-01-28 ENCOUNTER — Encounter: Payer: Self-pay | Admitting: Internal Medicine

## 2022-01-28 ENCOUNTER — Ambulatory Visit (INDEPENDENT_AMBULATORY_CARE_PROVIDER_SITE_OTHER): Payer: BC Managed Care – PPO

## 2022-01-28 VITALS — BP 118/68 | HR 80 | Temp 98.0°F | Ht 69.0 in | Wt 248.0 lb

## 2022-01-28 DIAGNOSIS — E559 Vitamin D deficiency, unspecified: Secondary | ICD-10-CM | POA: Diagnosis not present

## 2022-01-28 DIAGNOSIS — R079 Chest pain, unspecified: Secondary | ICD-10-CM

## 2022-01-28 DIAGNOSIS — M545 Low back pain, unspecified: Secondary | ICD-10-CM

## 2022-01-28 DIAGNOSIS — Z0001 Encounter for general adult medical examination with abnormal findings: Secondary | ICD-10-CM | POA: Diagnosis not present

## 2022-01-28 DIAGNOSIS — E669 Obesity, unspecified: Secondary | ICD-10-CM | POA: Diagnosis not present

## 2022-01-28 DIAGNOSIS — E7800 Pure hypercholesterolemia, unspecified: Secondary | ICD-10-CM | POA: Diagnosis not present

## 2022-01-28 DIAGNOSIS — Z6834 Body mass index (BMI) 34.0-34.9, adult: Secondary | ICD-10-CM

## 2022-01-28 DIAGNOSIS — R739 Hyperglycemia, unspecified: Secondary | ICD-10-CM

## 2022-01-28 DIAGNOSIS — R911 Solitary pulmonary nodule: Secondary | ICD-10-CM

## 2022-01-28 DIAGNOSIS — R1012 Left upper quadrant pain: Secondary | ICD-10-CM | POA: Diagnosis not present

## 2022-01-28 DIAGNOSIS — I10 Essential (primary) hypertension: Secondary | ICD-10-CM

## 2022-01-28 DIAGNOSIS — E039 Hypothyroidism, unspecified: Secondary | ICD-10-CM | POA: Diagnosis not present

## 2022-01-28 LAB — LIPID PANEL
Cholesterol: 157 mg/dL (ref 0–200)
HDL: 38.6 mg/dL — ABNORMAL LOW (ref >39.00)
LDL Cholesterol: 99 mg/dL (ref 0–99)
NonHDL: 118.27
Total CHOL/HDL Ratio: 4
Triglycerides: 96 mg/dL (ref 0.0–149.0)
VLDL: 19.2 mg/dL (ref 0.0–40.0)

## 2022-01-28 LAB — LIPASE: Lipase: 23 U/L (ref 11.0–59.0)

## 2022-01-28 LAB — HEPATIC FUNCTION PANEL
ALT: 23 U/L (ref 0–35)
AST: 20 U/L (ref 0–37)
Albumin: 4.3 g/dL (ref 3.5–5.2)
Alkaline Phosphatase: 45 U/L (ref 39–117)
Bilirubin, Direct: 0 mg/dL (ref 0.0–0.3)
Total Bilirubin: 0.4 mg/dL (ref 0.2–1.2)
Total Protein: 7.9 g/dL (ref 6.0–8.3)

## 2022-01-28 LAB — TSH: TSH: 2.92 u[IU]/mL (ref 0.35–5.50)

## 2022-01-28 LAB — URINALYSIS, ROUTINE W REFLEX MICROSCOPIC
Bilirubin Urine: NEGATIVE
Ketones, ur: NEGATIVE
Leukocytes,Ua: NEGATIVE
Nitrite: NEGATIVE
Specific Gravity, Urine: 1.02 (ref 1.000–1.030)
Total Protein, Urine: NEGATIVE
Urine Glucose: NEGATIVE
Urobilinogen, UA: 0.2 (ref 0.0–1.0)
pH: 6 (ref 5.0–8.0)

## 2022-01-28 LAB — BASIC METABOLIC PANEL
BUN: 10 mg/dL (ref 6–23)
CO2: 23 mEq/L (ref 19–32)
Calcium: 9.4 mg/dL (ref 8.4–10.5)
Chloride: 105 mEq/L (ref 96–112)
Creatinine, Ser: 0.9 mg/dL (ref 0.40–1.20)
GFR: 73.45 mL/min (ref >60.00)
Glucose, Bld: 94 mg/dL (ref 70–99)
Potassium: 4 mEq/L (ref 3.5–5.1)
Sodium: 138 mEq/L (ref 135–145)

## 2022-01-28 LAB — CBC WITH DIFFERENTIAL/PLATELET
Basophils Absolute: 0 10*3/uL (ref 0.0–0.1)
Basophils Relative: 0.7 % (ref 0.0–3.0)
Eosinophils Absolute: 0.1 10*3/uL (ref 0.0–0.7)
Eosinophils Relative: 1.3 % (ref 0.0–5.0)
HCT: 41.8 % (ref 36.0–46.0)
Hemoglobin: 14 g/dL (ref 12.0–15.0)
Lymphocytes Relative: 45.8 % (ref 12.0–46.0)
Lymphs Abs: 1.8 10*3/uL (ref 0.7–4.0)
MCHC: 33.5 g/dL (ref 30.0–36.0)
MCV: 92.7 fl (ref 78.0–100.0)
Monocytes Absolute: 0.3 10*3/uL (ref 0.1–1.0)
Monocytes Relative: 8 % (ref 3.0–12.0)
Neutro Abs: 1.8 10*3/uL (ref 1.4–7.7)
Neutrophils Relative %: 44.2 % (ref 43.0–77.0)
Platelets: 212 10*3/uL (ref 150.0–400.0)
RBC: 4.51 Mil/uL (ref 3.87–5.11)
RDW: 13.9 % (ref 11.5–15.5)
WBC: 4 10*3/uL (ref 4.0–10.5)

## 2022-01-28 LAB — T4, FREE: Free T4: 0.52 ng/dL — ABNORMAL LOW (ref 0.60–1.60)

## 2022-01-28 LAB — VITAMIN D 25 HYDROXY (VIT D DEFICIENCY, FRACTURES): VITD: 21.04 ng/mL — ABNORMAL LOW (ref 30.00–100.00)

## 2022-01-28 LAB — HEMOGLOBIN A1C: Hgb A1c MFr Bld: 6.2 % (ref 4.6–6.5)

## 2022-01-28 MED ORDER — LEVOTHYROXINE SODIUM 75 MCG PO TABS: 75.0000 ug | ORAL_TABLET | Freq: Every day | ORAL | 3 refills | Status: DC

## 2022-01-28 MED ORDER — MELOXICAM 15 MG PO TABS: 15.0000 mg | ORAL_TABLET | Freq: Every day | ORAL | 1 refills | Status: DC | PRN

## 2022-01-28 MED ORDER — CYCLOBENZAPRINE HCL 5 MG PO TABS: 5.0000 mg | ORAL_TABLET | Freq: Three times a day (TID) | ORAL | 1 refills | Status: DC | PRN

## 2022-01-28 MED ORDER — WEGOVY 0.5 MG/0.5ML ~~LOC~~ SOAJ: 0.5000 mg | SUBCUTANEOUS | 3 refills | Status: DC

## 2022-01-28 MED ORDER — AMOXICILLIN 875 MG PO TABS: 875.0000 mg | ORAL_TABLET | Freq: Two times a day (BID) | ORAL | 0 refills | Status: DC

## 2022-01-28 NOTE — Telephone Encounter (Signed)
Ruby with [facility] imaging calls today to make sure we have the X-Ray imaging on our end. I had let Ruby know they were available and she had let me know to inform Dr.John that their were findings on there. ?

## 2022-01-28 NOTE — Progress Notes (Signed)
Patient ID: Lauren Perkins, female   DOB: 1971/04/23, 51 y.o.   MRN: [id number] ? ? ? ?     Chief Complaint:: wellness exam and Office Visit (Pain on left side x3 days ) ? , left low back pain, low vit d, obesity ? ?     HPI:  Lauren Perkins is a 51 y.o. female here for wellness exam; declines shingrix, mammogram o/w up to date ?         ?              Also c/o left anterior costal margin pain and tenderness, mild to mod, 2wks, constant, dull and sharp, worse to palpate and bend and twist at the waist, better to sit more still.  No prior hx of same, no recent worsening wt gain.  Did have a fall recently but pain started prior to that.  No injury with fall but has had left lower back pain, dull, with radiation to the left buttock and abrasion to the area.Not taking Vit D.  Pt is interested in starting wegovy for wt loss, as she has not been able to low significant wt with diet and exercise.  Denies hyper or hypo thyroid symptoms such as voice, skin or hair change. ?  ?Wt Readings from Last 3 Encounters:  ?01/28/22 248 lb (112.5 kg)  ?09/07/21 250 lb (113.4 kg)  ?07/17/20 237 lb (107.5 kg)  ? ?BP Readings from Last 3 Encounters:  ?01/28/22 118/68  ?09/07/21 (!) 146/98  ?07/18/20 130/84  ? ?Immunization History  ?Administered Date(s) Administered  ? PFIZER(Purple Top)SARS-COV-2 Vaccination 04/12/2020, 05/05/2020  ? Tdap 06/14/2015  ?There are no preventive care reminders to display for this patient. ?  ? ?Past Medical History:  ?Diagnosis Date  ? Allergy   ? seasonal- occ Zyrtec  ? Cancer [facility])   ? atypical hyper plasia left breast   ? Fibroids   ? Hypothyroidism   ? ?Past Surgical History:  ?Procedure Laterality Date  ? BREAST LUMPECTOMY Left [id number]  ? atipical hyperplasia  ? EYE SURGERY    ? right eye when she was 3  ? ? reports that she has never smoked. She has never used smokeless tobacco. She reports that she does not drink alcohol and does not use drugs. ?family history includes Alcohol abuse in her  father; Allergic rhinitis in her mother; Arthritis in her mother; Asthma in her mother and sister; Breast cancer in her paternal aunt; Cancer in her paternal aunt; Diabetes in her mother; Heart disease in her mother; Hypertension in her mother; Mental illness in her father. ?No Known Allergies ?Current Outpatient Medications on File Prior to Visit  ?Medication Sig Dispense Refill  ? albuterol (VENTOLIN HFA) 108 (90 Base) MCG/ACT inhaler Inhale 2 puffs into the lungs every 6 (six) hours as needed for wheezing or shortness of breath. 18 g 1  ? Cholecalciferol (VITAMIN D) 50 MCG (2000 UT) tablet Take 1 tablet (2,000 Units total) by mouth daily. 30 tablet 99  ? clobetasol (TEMOVATE) 0.05 % external solution Apply 1-7 times per week 50 mL 3  ? Fluticasone Furoate (ARNUITY ELLIPTA) 200 MCG/ACT AEPB 1 inhalation 1 time per day 30 each 5  ? levothyroxine (EUTHYROX) 50 MCG tablet Take 1 tablet (50 mcg total) by mouth daily before breakfast. 90 tablet 3  ? montelukast (SINGULAIR) 10 MG tablet Take 1 tablet (10 mg total) by mouth at bedtime. 30 tablet 5  ? valACYclovir (VALTREX) 500 MG tablet Take 1 tablet (  500 mg total) by mouth 2 (two) times daily. 30 tablet 11  ? ?No current facility-administered medications on file prior to visit.  ? ?     ROS:  All others reviewed and negative. ? ?Objective  ? ?     PE:  BP 118/68 (BP Location: Left Arm, Patient Position: Sitting, Cuff Size: Large)   Pulse 80   Temp 98 ?F (36.7 ?C) (Oral)   Ht '5\' 9"'$  (1.753 m)   Wt 248 lb (112.5 kg)   LMP 06/09/2019   SpO2 96%   BMI 36.62 kg/m?  ? ?              Constitutional: Pt appears in NAD ?              HENT: Head: NCAT.  ?              Right Ear: External ear normal.   ?              Left Ear: External ear normal.  ?              Eyes: . Pupils are equal, round, and reactive to light. Conjunctivae and EOM are normal ?              Nose: without d/c or deformity ?              Neck: Neck supple. Gross normal ROM ?              Cardiovascular:  Normal rate and regular rhythm.   ?              Pulmonary/Chest: Effort normal and breath sounds without rales or wheezing.  ?              Abd:  Soft, NT, ND, + BS, no organomegaly with tender left anterior costal margin but no rash or swelling ?              Neurological: Pt is alert. At baseline orientation, motor grossly intact ?              Skin: Skin is warm. + abrasion to left lumbar paravertebral area with tender spasm underyling, LE edema - none ?              Psychiatric: Pt behavior is normal without agitation  ? ?Micro: none ? ?Cardiac tracings I have personally interpreted today:  none ? ?Pertinent Radiological findings (summarize): none  ? ?Lab Results  ?Component Value Date  ? WBC 4.0 01/28/2022  ? HGB 14.0 01/28/2022  ? HCT 41.8 01/28/2022  ? PLT 212.0 01/28/2022  ? GLUCOSE 94 01/28/2022  ? CHOL 157 01/28/2022  ? TRIG 96.0 01/28/2022  ? HDL 38.60 (L) 01/28/2022  ? [place] 99 01/28/2022  ? ALT 23 01/28/2022  ? AST 20 01/28/2022  ? NA 138 01/28/2022  ? K 4.0 01/28/2022  ? CL 105 01/28/2022  ? CREATININE 0.90 01/28/2022  ? BUN 10 01/28/2022  ? CO2 23 01/28/2022  ? TSH 2.92 01/28/2022  ? HGBA1C 6.2 01/28/2022  ? ?Assessment/Plan:  ?Lauren Perkins is a 51 y.o. Black or African American [2] female with  has a past medical history of Allergy, Cancer ([place]), Fibroids, and Hypothyroidism. ? ?Vitamin D deficiency ?Last vitamin D ?Lab Results  ?Component Value Date  ? VD25OH 28 (L) 09/07/2021  ? ?Ow, to start oral replacement ? ? ?Encounter for well adult exam with abnormal findings ?Age and  sex appropriate education and counseling updated with regular exercise and diet ?Referrals for preventative services - none needed ?Immunizations addressed - declines shingrix and covid booster ?Smoking counseling  - none needed ?Evidence for depression or other mood disorder - none significant ?Most recent labs reviewed. ?I have personally reviewed and have noted: ?1) the patient's medical and social history ?2) The  patient's current medications and supplements ?3) The patient's height, weight, and BMI have been recorded in the chart ? ? ?Hypothyroidism ?Lab Results  ?Component Value Date  ? TSH 2.92 01/28/2022  ? ?Stable, pt to continue levothyroxine ? ? ?Essential hypertension ?BP Readings from Last 3 Encounters:  ?01/28/22 118/68  ?09/07/21 (!) 146/98  ?07/18/20 130/84  ? ?Stable, pt to continue medical treatment  - diet, exercise, wt control ? ? ?Hyperglycemia ?Lab Results  ?Component Value Date  ? HGBA1C 6.2 01/28/2022  ? ?Stable, pt to continue current medical treatment - diet ? ? ?HLD (hyperlipidemia) ?Lab Results  ?Component Value Date  ? [place] 99 01/28/2022  ? ?Stable, pt to continue current low chol diet, decliens statin ? ? ?Chest pain ?With sore tender left costal margin noted - ? Etiology, for cxr,  to f/u any worsening symptoms or concerns ? ?Low back pain ?C/w msk strain with recent fall, for nsaid prn, muscle relaxer prn,  to f/u any worsening symptoms or concerns ? ?Obesity ?For wegovy asd,  to f/u any worsening symptoms or concerns ?Followup: No follow-ups on file. ? ?[name], MD 02/02/2022 11:26 AM ?[facility] ?[place] ?Internal Medicine ?

## 2022-01-28 NOTE — Patient Instructions (Signed)
Please take all new medication as prescribed - the muscle relaxer, and anti-inflammatory ? ?Please take all new medication as prescribed - the wegovy if OK with the insurance (you may need to call the HR dept at [facility] to make sure) ? ?Please continue all other medications as before, including the Vitamin D ? ?Please have the pharmacy call with any other refills you may need. ? ?Please continue your efforts at being more active, low cholesterol diet, and weight control. ? ?You are otherwise up to date with prevention measures today. ? ?Please keep your appointments with your specialists as you may have planned ? ?Please go to the XRAY Department in the first floor for the x-ray testing ? ?Please go to the LAB at the blood drawing area for the tests to be done ? ?You will be contacted by phone if any changes need to be made immediately.  Otherwise, you will receive a letter about your results with an explanation, but please check with MyChart first. ? ?Please remember to sign up for MyChart if you have not done so, as this will be important to you in the future with finding out test results, communicating by private email, and scheduling acute appointments online when needed. ? ? ? ? ? ?

## 2022-01-28 NOTE — Assessment & Plan Note (Signed)
Last vitamin D ?Lab Results  ?Component Value Date  ? VD25OH 28 (L) 09/07/2021  ? ?Ow, to start oral replacement ? ?

## 2022-02-01 ENCOUNTER — Telehealth: Payer: Self-pay | Admitting: *Deleted

## 2022-02-01 ENCOUNTER — Ambulatory Visit
Admission: RE | Admit: 2022-02-01 | Discharge: 2022-02-01 | Disposition: A | Discharge status: Home / Self Care | Payer: BC Managed Care – PPO | Source: Ambulatory Visit | Attending: Internal Medicine | Admitting: Internal Medicine

## 2022-02-01 DIAGNOSIS — R911 Solitary pulmonary nodule: Secondary | ICD-10-CM

## 2022-02-01 NOTE — Telephone Encounter (Signed)
Pt was on cover-my-meds need PA for [facility] completed PA w/ (Key: [id number]). Waiting on insurance determination../lm,b ?

## 2022-02-02 DIAGNOSIS — M545 Low back pain, unspecified: Secondary | ICD-10-CM | POA: Insufficient documentation

## 2022-02-02 NOTE — Assessment & Plan Note (Signed)
For wegovy asd,  to f/u any worsening symptoms or concerns ?

## 2022-02-02 NOTE — Assessment & Plan Note (Signed)
Lab Results  ?Component Value Date  ? [place] 99 01/28/2022  ? ?Stable, pt to continue current low chol diet, decliens statin ? ?

## 2022-02-02 NOTE — Assessment & Plan Note (Signed)
With sore tender left costal margin noted - ? Etiology, for cxr,  to f/u any worsening symptoms or concerns ?

## 2022-02-02 NOTE — Assessment & Plan Note (Signed)
Lab Results  ?Component Value Date  ? TSH 2.92 01/28/2022  ? ?Stable, pt to continue levothyroxine ? ?

## 2022-02-02 NOTE — Assessment & Plan Note (Signed)
Age and sex appropriate education and counseling updated with regular exercise and diet °Referrals for preventative services - none needed °Immunizations addressed - declines shingrix and covid booster °Smoking counseling  - none needed °Evidence for depression or other mood disorder - none significant °Most recent labs reviewed. °I have personally reviewed and have noted: °1) the patient's medical and social history °2) The patient's current medications and supplements °3) The patient's height, weight, and BMI have been recorded in the chart ° °

## 2022-02-02 NOTE — Assessment & Plan Note (Signed)
C/w msk strain with recent fall, for nsaid prn, muscle relaxer prn,  to f/u any worsening symptoms or concerns ?

## 2022-02-02 NOTE — Assessment & Plan Note (Signed)
Lab Results  ?Component Value Date  ? HGBA1C 6.2 01/28/2022  ? ?Stable, pt to continue current medical treatment - diet ? ?

## 2022-02-02 NOTE — Assessment & Plan Note (Signed)
BP Readings from Last 3 Encounters:  ?01/28/22 118/68  ?09/07/21 (!) 146/98  ?07/18/20 130/84  ? ?Stable, pt to continue medical treatment  - diet, exercise, wt control ? ?

## 2022-02-05 NOTE — Telephone Encounter (Signed)
Rec'd message on cover-my-meds PA was approved for [facility] effective 02/01/2022 - 09/03/2022. Faxed approval to [place].Marland KitchenJohny Chess ?

## 2022-02-20 ENCOUNTER — Encounter: Payer: Self-pay | Admitting: Internal Medicine

## 2022-02-20 ENCOUNTER — Ambulatory Visit (INDEPENDENT_AMBULATORY_CARE_PROVIDER_SITE_OTHER): Payer: BC Managed Care – PPO | Admitting: Internal Medicine

## 2022-02-20 VITALS — BP 168/90 | HR 75 | Temp 98.1°F | Ht 69.0 in | Wt 243.8 lb

## 2022-02-20 DIAGNOSIS — E669 Obesity, unspecified: Secondary | ICD-10-CM | POA: Diagnosis not present

## 2022-02-20 DIAGNOSIS — R739 Hyperglycemia, unspecified: Secondary | ICD-10-CM

## 2022-02-20 DIAGNOSIS — R1013 Epigastric pain: Secondary | ICD-10-CM | POA: Diagnosis not present

## 2022-02-20 DIAGNOSIS — E039 Hypothyroidism, unspecified: Secondary | ICD-10-CM | POA: Diagnosis not present

## 2022-02-20 DIAGNOSIS — I10 Essential (primary) hypertension: Secondary | ICD-10-CM

## 2022-02-20 DIAGNOSIS — Z6834 Body mass index (BMI) 34.0-34.9, adult: Secondary | ICD-10-CM

## 2022-02-20 MED ORDER — WEGOVY 0.5 MG/0.5ML ~~LOC~~ SOAJ: 0.5000 mg | SUBCUTANEOUS | 3 refills | Status: DC

## 2022-02-20 MED ORDER — PANTOPRAZOLE SODIUM 40 MG PO TBEC: 40.0000 mg | DELAYED_RELEASE_TABLET | Freq: Every day | ORAL | 3 refills | Status: DC

## 2022-02-20 NOTE — Patient Instructions (Addendum)
Please check your BP daily for 10 days and let us know the average  Ok to start the [facility] when it comes in  Please take all new medication as prescribed - the protonix 40 mg per day (also sent to [facility])  You will be contacted regarding the referral for: Gastroenterology  Please continue all other medications as before, and refills have been done if requested.  Please have the pharmacy call with any other refills you may need.  Please keep your appointments with your specialists as you may have planned  Since you started the new thyroid dose 2 wks ago - please plan to go to the [facility] in 2-3 more weeks to re-check the thyorid blood tests (no appt needed)  If all goes well, please plan to return here about May 2024 (or sooner if needed)

## 2022-02-20 NOTE — Progress Notes (Unsigned)
Patient ID: Lauren Perkins, female   DOB: July 29, 1971, 51 y.o.   MRN: [id number]        Chief Complaint: follow up HTN, obesity, low thyroid, and epigastric pain       HPI:  Lauren Perkins is a 51 y.o. female here with c/o mild epigastric pain , intemittent but persistent x months, without radiation, has some nausea but no vomiting, Denies worsening reflux, dysphagia, other bowel change or blood. Denies hyper or hypo thyroid symptoms such as voice, skin or hair change.  BP at home < 140/90.  Hard to lose wt, despite increased activity and less calories, but did lose 5 recent.  Pt denies chest pain, increased sob or doe, wheezing, orthopnea, PND, increased LE swelling, palpitations, dizziness or syncope.   Pt denies polydipsia, polyuria, or new focal neuro s/s.            Wt Readings from Last 3 Encounters:  02/20/22 243 lb 12.8 oz (110.6 kg)  01/28/22 248 lb (112.5 kg)  09/07/21 250 lb (113.4 kg)   BP Readings from Last 3 Encounters:  02/20/22 (!) 168/90  01/28/22 118/68  09/07/21 (!) 146/98         Past Medical History:  Diagnosis Date   Allergy    seasonal- occ Zyrtec   Cancer ([place])    atypical hyper plasia left breast    Fibroids    Hypothyroidism    Past Surgical History:  Procedure Laterality Date   BREAST LUMPECTOMY Left [id number]   atipical hyperplasia   EYE SURGERY     right eye when she was 3    reports that she has never smoked. She has never used smokeless tobacco. She reports that she does not drink alcohol and does not use drugs. family history includes Alcohol abuse in her father; Allergic rhinitis in her mother; Arthritis in her mother; Asthma in her mother and sister; Breast cancer in her paternal aunt; Cancer in her paternal aunt; Diabetes in her mother; Heart disease in her mother; Hypertension in her mother; Mental illness in her father. No Known Allergies Current Outpatient Medications on File Prior to Visit  Medication Sig Dispense Refill   albuterol  (VENTOLIN HFA) 108 (90 Base) MCG/ACT inhaler Inhale 2 puffs into the lungs every 6 (six) hours as needed for wheezing or shortness of breath. 18 g 1   Cholecalciferol (VITAMIN D) 50 MCG (2000 UT) tablet Take 1 tablet (2,000 Units total) by mouth daily. 30 tablet 99   clobetasol (TEMOVATE) 0.05 % external solution Apply 1-7 times per week 50 mL 3   cyclobenzaprine (FLEXERIL) 5 MG tablet Take 1 tablet (5 mg total) by mouth 3 (three) times daily as needed for muscle spasms. 40 tablet 1   Fluticasone Furoate (ARNUITY ELLIPTA) 200 MCG/ACT AEPB 1 inhalation 1 time per day 30 each 5   levothyroxine (EUTHYROX) 50 MCG tablet Take 1 tablet (50 mcg total) by mouth daily before breakfast. 90 tablet 3   levothyroxine (SYNTHROID) 75 MCG tablet Take 1 tablet (75 mcg total) by mouth daily. 90 tablet 3   meloxicam (MOBIC) 15 MG tablet Take 1 tablet (15 mg total) by mouth daily as needed for pain. 90 tablet 1   montelukast (SINGULAIR) 10 MG tablet Take 1 tablet (10 mg total) by mouth at bedtime. 30 tablet 5   valACYclovir (VALTREX) 500 MG tablet Take 1 tablet (500 mg total) by mouth 2 (two) times daily. 30 tablet 11   No current facility-administered medications on  file prior to visit.        ROS:  All others reviewed and negative.  Objective        PE:  BP (!) 168/90 (BP Location: Right Arm, Patient Position: Sitting, Cuff Size: Large)   Pulse 75   Temp 98.1 F (36.7 C) (Oral)   Ht '5\' 9"'$  (1.753 m)   Wt 243 lb 12.8 oz (110.6 kg)   LMP 06/09/2019   SpO2 97%   BMI 36.00 kg/m                 Constitutional: Pt appears in NAD               HENT: Head: NCAT.                Right Ear: External ear normal.                 Left Ear: External ear normal.                Eyes: . Pupils are equal, round, and reactive to light. Conjunctivae and EOM are normal               Nose: without d/c or deformity               Neck: Neck supple. Gross normal ROM               Cardiovascular: Normal rate and regular  rhythm.                 Pulmonary/Chest: Effort normal and breath sounds without rales or wheezing.                Abd:  Soft, NT, ND, + BS, no organomegaly               Neurological: Pt is alert. At baseline orientation, motor grossly intact               Skin: Skin is warm. No rashes, no other new lesions, LE edema - none               Psychiatric: Pt behavior is normal without agitation   Micro: none  Cardiac tracings I have personally interpreted today:  none  Pertinent Radiological findings (summarize): none   Lab Results  Component Value Date   WBC 4.0 01/28/2022   HGB 14.0 01/28/2022   HCT 41.8 01/28/2022   PLT 212.0 01/28/2022   GLUCOSE 94 01/28/2022   CHOL 157 01/28/2022   TRIG 96.0 01/28/2022   HDL 38.60 (L) 01/28/2022   LDLCALC 99 01/28/2022   ALT 23 01/28/2022   AST 20 01/28/2022   NA 138 01/28/2022   K 4.0 01/28/2022   CL 105 01/28/2022   CREATININE 0.90 01/28/2022   BUN 10 01/28/2022   CO2 23 01/28/2022   TSH 2.92 01/28/2022   HGBA1C 6.2 01/28/2022   Assessment/Plan:  Lauren Perkins is a 51 y.o. Black or African American [2] female with  has a past medical history of Allergy, Cancer ([place]), Fibroids, and Hypothyroidism.  Hypothyroidism Lab Results  Component Value Date   TSH 2.92 01/28/2022   Stable, pt to continue levothyroxine 50/75 asd and f/u lab   Hyperglycemia Lab Results  Component Value Date   HGBA1C 6.2 01/28/2022   Stable, pt to continue current medical treatment  - diet  Essential hypertension BP Readings from Last 3 Encounters:  02/20/22 (!) 168/90  01/28/22 118/68  09/07/21 Marland Kitchen)  146/98   uncontrolled, BP at home < 140/90, pt to continue medical treatment  -  Wt control, low salt diet   Obesity dw pt - for wegovy if ok with insurance  Epigastric pain Etiology unclear, for PPI trial protonix 40 qd, for refer GI per pt request  Followup: Return if symptoms worsen or fail to improve.  [name], MD 02/21/2022 9:11 PM [place]

## 2022-02-21 DIAGNOSIS — R1013 Epigastric pain: Secondary | ICD-10-CM | POA: Insufficient documentation

## 2022-02-21 NOTE — Assessment & Plan Note (Signed)
Lab Results  Component Value Date   HGBA1C 6.2 01/28/2022   Stable, pt to continue current medical treatment  - diet

## 2022-02-21 NOTE — Assessment & Plan Note (Addendum)
Etiology unclear, for PPI trial protonix 40 qd, for refer GI per pt request

## 2022-02-21 NOTE — Assessment & Plan Note (Signed)
BP Readings from Last 3 Encounters:  02/20/22 (!) 168/90  01/28/22 118/68  09/07/21 (!) 146/98   uncontrolled, BP at home < 140/90, pt to continue medical treatment  -  Wt control, low salt diet

## 2022-02-21 NOTE — Assessment & Plan Note (Signed)
dw pt - for wegovy if ok with insurance

## 2022-02-21 NOTE — Assessment & Plan Note (Addendum)
Lab Results  Component Value Date   TSH 2.92 01/28/2022   Stable, pt to continue levothyroxine 50/75 asd and f/u lab

## 2022-04-10 ENCOUNTER — Encounter: Payer: Self-pay | Admitting: Internal Medicine

## 2022-07-17 ENCOUNTER — Encounter: Payer: Self-pay | Admitting: Gastroenterology

## 2022-08-06 ENCOUNTER — Telehealth: Payer: Self-pay | Admitting: Internal Medicine

## 2022-08-06 NOTE — Telephone Encounter (Signed)
Pt called for new RX for WEGOVY.  Preferred pharmacy:  [facility] [place] [facility] [address] [address] [place], [place] [address] Phone: [phone]  Fax: [phone]   Pt phone [phone]  ADDITIONAL REQUEST:  Pt request RX for DIFLUCAN  Yeast symptoms for 3 days.  Pt does not have a gynecologist.

## 2022-08-07 NOTE — Telephone Encounter (Signed)
Pt called checking status of refills requested.

## 2022-08-08 MED ORDER — WEGOVY 0.5 MG/0.5ML ~~LOC~~ SOAJ: 0.5000 mg | SUBCUTANEOUS | 3 refills | Status: DC

## 2022-08-08 MED ORDER — FLUCONAZOLE 150 MG PO TABS: ORAL_TABLET | ORAL | 1 refills | Status: DC

## 2022-08-08 NOTE — Addendum Note (Signed)
Addended by: Biagio Borg on: 08/08/2022 12:46 PM   Modules accepted: Orders

## 2022-08-08 NOTE — Telephone Encounter (Signed)
Notified pt MD resent rx's to [facility].Marland KitchenJohny Chess

## 2022-08-08 NOTE — Telephone Encounter (Signed)
Notified pt w/MD response. Pt states she need the wegovy to go to [place]. Resent rx to [facility].Marland KitchenJohny Chess

## 2022-08-08 NOTE — Telephone Encounter (Signed)
Ok this is done 

## 2022-08-08 NOTE — Addendum Note (Signed)
Addended by: Earnstine Regal on: 08/08/2022 01:58 PM   Modules accepted: Orders

## 2022-08-09 ENCOUNTER — Ambulatory Visit: Payer: BC Managed Care – PPO | Admitting: Family Medicine

## 2022-08-09 ENCOUNTER — Encounter: Payer: Self-pay | Admitting: Family Medicine

## 2022-08-09 VITALS — BP 176/107 | HR 76 | Ht 69.0 in | Wt 230.8 lb

## 2022-08-09 DIAGNOSIS — R300 Dysuria: Secondary | ICD-10-CM

## 2022-08-09 DIAGNOSIS — N3001 Acute cystitis with hematuria: Secondary | ICD-10-CM

## 2022-08-09 DIAGNOSIS — R350 Frequency of micturition: Secondary | ICD-10-CM

## 2022-08-09 LAB — POCT URINALYSIS DIPSTICK
Bilirubin, UA: NEGATIVE
Glucose, UA: NEGATIVE
Ketones, UA: NEGATIVE
Leukocytes, UA: NEGATIVE
Nitrite, UA: NEGATIVE
Protein, UA: POSITIVE — AB
Spec Grav, UA: 1.015
Urobilinogen, UA: NEGATIVE U/dL — AB
pH, UA: 6

## 2022-08-09 MED ORDER — NITROFURANTOIN MONOHYD MACRO 100 MG PO CAPS: 100.0000 mg | ORAL_CAPSULE | Freq: Two times a day (BID) | ORAL | 0 refills | Status: DC

## 2022-08-09 NOTE — Progress Notes (Signed)
OFFICE VISIT  08/09/2022  CC:  Chief Complaint  Patient presents with   Groin pain   Burning with urination    X3 days, she also c/o discomfort.    Frequent urination    Patient is a 51 y.o. female who presents for urinary concern.  HPI: 2 to 3 days of suprapubic discomfort, dysuria, urinary urgency, and urinary frequency. No gross blood.  No flank pain or back pain or fever.  No nausea.   Past Medical History:  Diagnosis Date   Allergy    seasonal- occ Zyrtec   Cancer ([place])    atypical hyper plasia left breast    Fibroids    Hypothyroidism     Past Surgical History:  Procedure Laterality Date   BREAST LUMPECTOMY Left [id number]   atipical hyperplasia   EYE SURGERY     right eye when she was 3    Outpatient Medications Prior to Visit  Medication Sig Dispense Refill   Cholecalciferol (VITAMIN D) 50 MCG (2000 UT) tablet Take 1 tablet (2,000 Units total) by mouth daily. 30 tablet 99   clobetasol (TEMOVATE) 0.05 % external solution Apply 1-7 times per week 50 mL 3   cyclobenzaprine (FLEXERIL) 5 MG tablet Take 1 tablet (5 mg total) by mouth 3 (three) times daily as needed for muscle spasms. 40 tablet 1   Fluticasone Furoate (ARNUITY ELLIPTA) 200 MCG/ACT AEPB 1 inhalation 1 time per day 30 each 5   levothyroxine (EUTHYROX) 50 MCG tablet Take 1 tablet (50 mcg total) by mouth daily before breakfast. 90 tablet 3   levothyroxine (SYNTHROID) 75 MCG tablet Take 1 tablet (75 mcg total) by mouth daily. 90 tablet 3   montelukast (SINGULAIR) 10 MG tablet Take 1 tablet (10 mg total) by mouth at bedtime. 30 tablet 5   pantoprazole (PROTONIX) 40 MG tablet Take 1 tablet (40 mg total) by mouth daily. 90 tablet 3   Semaglutide-Weight Management (WEGOVY) 0.5 MG/0.5ML SOAJ Inject 0.5 mg into the skin once a week. 6 mL 3   valACYclovir (VALTREX) 500 MG tablet Take 1 tablet (500 mg total) by mouth 2 (two) times daily. 30 tablet 11   albuterol (VENTOLIN HFA) 108 (90 Base) MCG/ACT inhaler Inhale  2 puffs into the lungs every 6 (six) hours as needed for wheezing or shortness of breath. (Patient not taking: Reported on 08/09/2022) 18 g 1   fluconazole (DIFLUCAN) 150 MG tablet 1 tab by mouth every 3 days as needed (Patient not taking: Reported on 08/09/2022) 2 tablet 1   meloxicam (MOBIC) 15 MG tablet Take 1 tablet (15 mg total) by mouth daily as needed for pain. (Patient not taking: Reported on 08/09/2022) 90 tablet 1   No facility-administered medications prior to visit.    No Known Allergies  ROS As per HPI  PE:    08/09/2022    3:56 PM 02/20/2022    1:19 PM 01/28/2022    9:06 AM  Vitals with BMI  Height '5\' 9"'$  '5\' 9"'$  '5\' 9"'$   Weight 230 lbs 13 oz 243 lbs 13 oz 248 lbs  BMI 34.07 32.99 24.26  Systolic 834 196 222  Diastolic 979 90 68  Pulse 76 75 80     Physical Exam  Gen: Alert, well appearing.  Patient is oriented to person, place, time, and situation. AFFECT: pleasant, lucid thought and speech. No further exam today  LABS:  Last CBC Lab Results  Component Value Date   WBC 4.0 01/28/2022   HGB 14.0 01/28/2022  HCT 41.8 01/28/2022   MCV 92.7 01/28/2022   MCH 31.4 09/07/2021   RDW 13.9 01/28/2022   PLT 212.0 29/92/4268   Last metabolic panel Lab Results  Component Value Date   GLUCOSE 94 01/28/2022   NA 138 01/28/2022   K 4.0 01/28/2022   CL 105 01/28/2022   CO2 23 01/28/2022   BUN 10 01/28/2022   CREATININE 0.90 01/28/2022   GFRNONAA >60 11/05/2019   CALCIUM 9.4 01/28/2022   PROT 7.9 01/28/2022   ALBUMIN 4.3 01/28/2022   BILITOT 0.4 01/28/2022   ALKPHOS 45 01/28/2022   AST 20 01/28/2022   ALT 23 01/28/2022   ANIONGAP 12 11/05/2019   Last hemoglobin A1c Lab Results  Component Value Date   HGBA1C 6.2 01/28/2022   IMPRESSION AND PLAN:  Acute UTI with microhematuria Dipstick urinalysis today with 2+ blood, trace protein. Macrobid 100 twice daily x 3 days. Urine sent for culture and sensitivities.  Of note, she has a history of  microhematuria that has been detected in the context of UTI symptoms as well as sometimes when asymptomatic (annual cpe). She states she had urology work-up approximately 3 years ago for this and it was unrevealing.  An After Visit Summary was printed and given to the patient.  FOLLOW UP: No follow-ups on file.  Signed:  Crissie Sickles, MD           08/09/2022

## 2022-08-11 LAB — URINE CULTURE
MICRO NUMBER:: 14206971
SPECIMEN QUALITY:: ADEQUATE

## 2022-08-12 ENCOUNTER — Telehealth: Payer: Self-pay

## 2022-08-12 DIAGNOSIS — R3129 Other microscopic hematuria: Secondary | ICD-10-CM

## 2022-08-12 DIAGNOSIS — R102 Pelvic and perineal pain: Secondary | ICD-10-CM

## 2022-08-12 NOTE — Telephone Encounter (Signed)
Patient would like referral to urologist.  Please call [phone].

## 2022-08-12 NOTE — Telephone Encounter (Signed)
Lauren Perkins, urology referral ordered

## 2022-08-12 NOTE — Telephone Encounter (Signed)
Please review and advise.

## 2022-08-27 ENCOUNTER — Ambulatory Visit (INDEPENDENT_AMBULATORY_CARE_PROVIDER_SITE_OTHER): Payer: BC Managed Care – PPO | Admitting: Gastroenterology

## 2022-08-27 ENCOUNTER — Encounter: Payer: Self-pay | Admitting: Gastroenterology

## 2022-08-27 VITALS — BP 140/72 | HR 82 | Ht 69.0 in | Wt 231.2 lb

## 2022-08-27 DIAGNOSIS — R1012 Left upper quadrant pain: Secondary | ICD-10-CM | POA: Diagnosis not present

## 2022-08-27 DIAGNOSIS — K760 Fatty (change of) liver, not elsewhere classified: Secondary | ICD-10-CM | POA: Diagnosis not present

## 2022-08-27 DIAGNOSIS — Z86010 Personal history of colonic polyps: Secondary | ICD-10-CM | POA: Diagnosis not present

## 2022-08-27 NOTE — Patient Instructions (Addendum)
You have been scheduled for an endoscopy. Please follow written instructions given to you at your visit today. If you use inhalers (even only as needed), please bring them with you on the day of your procedure.   If you are age 53 or older, your body mass index should be between 23-30. Your Body mass index is 34.15 kg/m. If this is out of the aforementioned range listed, please consider follow up with your Primary Care Provider.  If you are age 65 or younger, your body mass index should be between 19-25. Your Body mass index is 34.15 kg/m. If this is out of the aformentioned range listed, please consider follow up with your Primary Care Provider.   __________________________________________________________  The [facility] providers would like to encourage you to use [facility] to communicate with providers for non-urgent requests or questions.  Due to long hold times on the telephone, sending your provider a message by [facility] may be a faster and more efficient way to get a response.  Please allow 48 business hours for a response.  Please remember that this is for non-urgent requests.   Due to recent changes in healthcare laws, you may see the results of your imaging and laboratory studies on MyChart before your provider has had a chance to review them.  We understand that in some cases there may be results that are confusing or concerning to you. Not all laboratory results come back in the same time frame and the provider may be waiting for multiple results in order to interpret others.  Please give [place] 48 hours in order for your provider to thoroughly review all the results before contacting the office for clarification of your results.     Thank you for choosing me and [facility].  Vito Cirigliano, D.O.

## 2022-08-27 NOTE — Progress Notes (Signed)
Chief Complaint: Abdominal pain   Referring Provider:     Biagio Borg, MD   HPI:     Lauren Perkins is a 51 y.o. female referred to the Gastroenterology Clinic for evaluation of abdominal pain.  Focal pain in LUQ, right under left breast. Started ~May 2023. Present most days, but not everyday. Described as pressure and feels like it is located behind her rib cage. Worse with sitting upright all day, and improves with laying down and stretching. Sometimes worse after eating, independent of food types. No heartburn, dysphagia. No SOB, DOE, lightheadedness. Sxs can last 3-4 days continuously then subsides. Rates as 4-5/10 when present.   Was evaluated by her PCM for this issue earlier this year in 01/2022.  Normal CBC, CMP, lipase in 01/2022.   Vitamin D 21.  CXR with nodular opacity over left lower hemithorax.  CT chest on 02/01/2022 w/ normal appearing lungs, no MSK disease, and incidentally noted probable hepatic steatosis.  No recent dedicated abdominal/hepatic imaging for comparison review. Trialed Protonix 40 mg daily without improvement (took prn).    Endoscopic History: - Colonoscopy (08/2019): 4 mm sigmoid polyp (adenoma), otherwise normal.  Repeat 7 years   No known family history of CRC, GI malignancy, liver disease, pancreatic disease, or IBD.     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    Fibroids    Hypothyroidism      Past Surgical History:  Procedure Laterality Date   BREAST LUMPECTOMY Left [id number]   atipical hyperplasia   EYE SURGERY     right eye when she was 58   Family History  Problem Relation Age of Onset   Arthritis Mother    Heart disease Mother    Hypertension Mother    Diabetes Mother    Asthma Mother    Allergic rhinitis Mother    Alcohol abuse Father    Mental illness Father    Cancer Paternal Aunt        breast   Breast cancer  Paternal Aunt    Asthma Sister    Colon cancer Neg Hx    Colon polyps Neg Hx    Esophageal cancer Neg Hx    Stomach cancer Neg Hx    Rectal cancer Neg Hx    Social History   Tobacco Use   Smoking status: Never   Smokeless tobacco: Never  Vaping Use   Vaping Use: Never used  Substance Use Topics   Alcohol use: No    Alcohol/week: 0.0 standard drinks of alcohol   Drug use: No   Current Outpatient Medications  Medication Sig Dispense Refill   Cholecalciferol (VITAMIN D) 50 MCG (2000 UT) tablet Take 1 tablet (2,000 Units total) by mouth daily. 30 tablet 99   clobetasol (TEMOVATE) 0.05 % external solution Apply 1-7 times per week 50 mL 3   cyclobenzaprine (FLEXERIL) 5 MG tablet Take 1 tablet (5 mg total) by mouth 3 (three) times daily as needed for muscle spasms. 40 tablet 1   Fluticasone Furoate (ARNUITY ELLIPTA) 200 MCG/ACT AEPB 1 inhalation 1 time per day 30 each 5   levothyroxine (EUTHYROX) 50 MCG tablet Take 1 tablet (50 mcg total) by mouth daily before breakfast. 90 tablet 3   levothyroxine (SYNTHROID) 75 MCG tablet Take 1 tablet (75 mcg total) by  mouth daily. 90 tablet 3   montelukast (SINGULAIR) 10 MG tablet Take 1 tablet (10 mg total) by mouth at bedtime. 30 tablet 5   nitrofurantoin, macrocrystal-monohydrate, (MACROBID) 100 MG capsule Take 1 capsule (100 mg total) by mouth 2 (two) times daily. 10 capsule 0   pantoprazole (PROTONIX) 40 MG tablet Take 1 tablet (40 mg total) by mouth daily. 90 tablet 3   Semaglutide-Weight Management (WEGOVY) 0.5 MG/0.5ML SOAJ Inject 0.5 mg into the skin once a week. 6 mL 3   valACYclovir (VALTREX) 500 MG tablet Take 1 tablet (500 mg total) by mouth 2 (two) times daily. 30 tablet 11   albuterol (VENTOLIN HFA) 108 (90 Base) MCG/ACT inhaler Inhale 2 puffs into the lungs every 6 (six) hours as needed for wheezing or shortness of breath. (Patient not taking: Reported on 08/09/2022) 18 g 1   fluconazole (DIFLUCAN) 150 MG tablet 1 tab by mouth every 3  days as needed (Patient not taking: Reported on 08/09/2022) 2 tablet 1   meloxicam (MOBIC) 15 MG tablet Take 1 tablet (15 mg total) by mouth daily as needed for pain. (Patient not taking: Reported on 08/09/2022) 90 tablet 1   No current facility-administered medications for this visit.   No Known Allergies   Review of Systems: All systems reviewed and negative except where noted in HPI.     Physical Exam:    Wt Readings from Last 3 Encounters:  08/27/22 231 lb 4 oz (104.9 kg)  08/09/22 230 lb 12.8 oz (104.7 kg)  02/20/22 243 lb 12.8 oz (110.6 kg)    BP (!) 140/72   Pulse 82   Ht '5\' 9"'$  (1.753 m)   Wt 231 lb 4 oz (104.9 kg)   LMP 06/09/2019   BMI 34.15 kg/m  Constitutional:  Pleasant, in no acute distress. Psychiatric: Normal mood and affect. Behavior is normal. Cardiovascular: Normal rate, regular rhythm. No edema Pulmonary/chest: Effort normal and breath sounds normal. No wheezing, rales or rhonchi. Abdominal: Soft, nondistended, nontender. Bowel sounds active throughout. There are no masses palpable. No hepatomegaly, no splenomegaly. Neurological: Alert and oriented to person place and time. Skin: Skin is warm and dry. No rashes noted. MSK: No tenderness with rib or external palpation.  Palpation over left lower rib cage with patient pointing to does not elicit TTP, but she feels the discomfort below the rib cage.  Negative Carnett's sign.   ASSESSMENT AND PLAN;   1) LUQ pain 6+ month history of discomfort in the LUQ.  More so located in the left inferior thoracic area, located beneath the ribs.  Could not elicit TTP with palpation.  Discussed potential GI etiologies for presenting symptoms.  Symptoms and clinical exam seemingly more consistent with MSK etiology, but based on location possibly some atypical presenting pathology at the [facility] or proximal stomach.  Plan as follows: - EGD to evaluate for esophagitis, PUD, gastritis, HH - If EGD unrevealing and ongoing symptoms,  may benefit from referral to Sports Medicine  2) History of colon polyps - Repeat colonoscopy 2027  3) Hepatic steatosis Recent CT chest with incidentally noted probable hepatic steatosis.  Otherwise normal liver enzymes - Continue regular exercise and healthy eating - Currently prescribed Wegovy.  Recent literature supporting benefits of GLP-1 in MASLD (previously NAFLD)   The indications, risks, and benefits of EGD were explained to the patient in detail. Risks include but are not limited to bleeding, perforation, adverse reaction to medications, and cardiopulmonary compromise. Sequelae include but are not limited to  the possibility of surgery, hospitalization, and mortality. The patient verbalized understanding and wished to proceed. All questions answered, referred to scheduler. Further recommendations pending results of the exam.      Lavena Bullion, DO, FACG  08/27/2022, 3:11 PM   Jenny Reichmann Hunt Oris, MD

## 2022-09-26 ENCOUNTER — Telehealth: Payer: Self-pay | Admitting: Internal Medicine

## 2022-09-26 MED ORDER — WEGOVY 1 MG/0.5ML ~~LOC~~ SOAJ: 1.0000 mg | SUBCUTANEOUS | 11 refills | Status: DC

## 2022-09-26 NOTE — Addendum Note (Signed)
Addended by: Biagio Borg on: 09/26/2022 11:28 AM   Modules accepted: Orders

## 2022-09-26 NOTE — Telephone Encounter (Signed)
Pt states that [facility] requires a PA for the [facility] RX and would like for [place] to start a PA for her.

## 2022-09-26 NOTE — Telephone Encounter (Signed)
Informed patient of status of PA, she asked if the dose of the [facility] should be increased from 0.5 to 21m?

## 2022-09-26 NOTE — Telephone Encounter (Signed)
PA started and sent to plan, Please wait for [employer] NCPDP 2017 to return a determination. Key: [id number]

## 2022-09-26 NOTE — Telephone Encounter (Signed)
Ok I have sent new rx for the 1 mg weekly wegovy

## 2022-10-01 ENCOUNTER — Encounter: Payer: Self-pay | Admitting: Certified Registered Nurse Anesthetist

## 2022-10-01 NOTE — Telephone Encounter (Signed)
Pt has a colonoscopy scheduled for 10/03/22.  Called pt to see if she has started the [facility] injection that was recently prescribed.  Pt stated that she has not started the [facility] prescription yet because she does not have it.  Advised pt not to start Wegovy injection until after her procedure since it is [facility] protocol that pt cannot take 3 days prior to procedure to avoid procedure being cancelled.  Pt voiced understanding.  Pt asked about taking NSAIDS while on the phone.  Advised pt not to take NSAIDS or Meloxicam (Mobic) prior to procedure per [facility] protocol to hold for 7 days.  Pt voiced understanding.

## 2022-10-03 ENCOUNTER — Encounter: Payer: Self-pay | Admitting: Gastroenterology

## 2022-10-03 ENCOUNTER — Ambulatory Visit (AMBULATORY_SURGERY_CENTER): Payer: BC Managed Care – PPO | Admitting: Gastroenterology

## 2022-10-03 VITALS — BP 133/89 | HR 81 | Temp 98.4°F | Resp 13 | Ht 69.0 in | Wt 231.0 lb

## 2022-10-03 DIAGNOSIS — R1012 Left upper quadrant pain: Secondary | ICD-10-CM

## 2022-10-03 MED ORDER — SODIUM CHLORIDE 0.9 % IV SOLN: 500.0000 mL | INTRAVENOUS | Status: DC

## 2022-10-03 NOTE — Op Note (Signed)
[facility] Patient Name: Lauren Perkins Procedure Date: 10/03/2022 9:48 AM MRN: [id number] Endoscopist: Gerrit Heck , MD, [id number] Age: 52 Referring MD:  Date of Birth: 06-29-1971 Gender: Female Account #: [id number] Procedure:                Upper GI endoscopy Indications:              Abdominal pain in the left upper quadrant Medicines:                Monitored Anesthesia Care Procedure:                Pre-Anesthesia Assessment:                           - Prior to the procedure, a History and Physical                            was performed, and patient medications and                            allergies were reviewed. The patient's tolerance of                            previous anesthesia was also reviewed. The risks                            and benefits of the procedure and the sedation                            options and risks were discussed with the patient.                            All questions were answered, and informed consent                            was obtained. Prior Anticoagulants: The patient has                            taken no anticoagulant or antiplatelet agents. ASA                            Grade Assessment: II - A patient with mild systemic                            disease. After reviewing the risks and benefits,                            the patient was deemed in satisfactory condition to                            undergo the procedure.                           After obtaining informed consent, the endoscope was  passed under direct vision. Throughout the                            procedure, the patient's blood pressure, pulse, and                            oxygen saturations were monitored continuously. The                            Endoscope Olympus [id number] was introduced through                            the mouth, and advanced to the third part of                            duodenum.  The upper GI endoscopy was accomplished                            without difficulty. The patient tolerated the                            procedure well. Scope In: Scope Out: Findings:                 The examined esophagus was normal.                           The Z-line was regular and was found 41 cm from the                            incisors.                           The entire examined stomach was normal. Biopsies                            were taken with a cold forceps for Helicobacter                            pylori testing. Estimated blood loss was minimal.                           The examined duodenum was normal. Complications:            No immediate complications. Estimated Blood Loss:     Estimated blood loss was minimal. Impression:               - Normal esophagus.                           - Z-line regular, 41 cm from the incisors.                           - Normal stomach. Biopsied.                           - Normal  examined duodenum. Recommendation:           - Patient has a contact number available for                            emergencies. The signs and symptoms of potential                            delayed complications were discussed with the                            patient. Return to normal activities tomorrow.                            Written discharge instructions were provided to the                            patient.                           - Resume previous diet.                           - Continue present medications.                           - Await pathology results.                           - Perform a HIDA (hepatobiliary iminodiacetic acid)                            scan at appointment to be scheduled. Gerrit Heck, MD 10/03/2022 10:04:33 AM

## 2022-10-03 NOTE — Progress Notes (Signed)
GASTROENTEROLOGY PROCEDURE H&P NOTE   Primary Care Physician: Biagio Borg, MD    Reason for Procedure:   LUQ pain  Plan:    EGD  Patient is appropriate for endoscopic procedure(s) in the ambulatory ([place]) setting.  The nature of the procedure, as well as the risks, benefits, and alternatives were carefully and thoroughly reviewed with the patient. Ample time for discussion and questions allowed. The patient understood, was satisfied, and agreed to proceed.     HPI: Lauren Perkins is a 52 y.o. female who presents for EGD for evaluation of LUQ pain. No changes in clinical hx since OV 08/27/2022 with me.    Past Medical History:  Diagnosis Date   Allergy    seasonal- occ Zyrtec   Cancer ([place])    atypical hyper plasia left breast    Fibroids    Hypothyroidism     Past Surgical History:  Procedure Laterality Date   BREAST LUMPECTOMY Left [id number]   atipical hyperplasia   EYE SURGERY     right eye when she was 3    Prior to Admission medications   Medication Sig Start Date End Date Taking? Authorizing Provider  Cholecalciferol (VITAMIN D) 50 MCG (2000 UT) tablet Take 1 tablet (2,000 Units total) by mouth daily. 09/07/21  Yes Biagio Borg, MD  levothyroxine (SYNTHROID) 75 MCG tablet Take 1 tablet (75 mcg total) by mouth daily. 01/28/22  Yes Biagio Borg, MD  albuterol (VENTOLIN HFA) 108 (90 Base) MCG/ACT inhaler Inhale 2 puffs into the lungs every 6 (six) hours as needed for wheezing or shortness of breath. Patient not taking: Reported on 08/09/2022 10/29/19   Biagio Borg, MD  clobetasol (TEMOVATE) 0.05 % external solution Apply 1-7 times per week 04/21/20   Kozlow, Donnamarie Poag, MD  cyclobenzaprine (FLEXERIL) 5 MG tablet Take 1 tablet (5 mg total) by mouth 3 (three) times daily as needed for muscle spasms. Patient not taking: Reported on 10/03/2022 01/28/22   Biagio Borg, MD  Fluticasone Furoate (ARNUITY ELLIPTA) 200 MCG/ACT AEPB 1 inhalation 1 time per day 04/19/20    Jiles Prows, MD  meloxicam (MOBIC) 15 MG tablet Take 1 tablet (15 mg total) by mouth daily as needed for pain. Patient not taking: Reported on 08/09/2022 01/28/22   Biagio Borg, MD  montelukast (SINGULAIR) 10 MG tablet Take 1 tablet (10 mg total) by mouth at bedtime. Patient not taking: Reported on 10/03/2022 04/19/20   Jiles Prows, MD  pantoprazole (PROTONIX) 40 MG tablet Take 1 tablet (40 mg total) by mouth daily. Patient not taking: Reported on 10/03/2022 02/20/22   Biagio Borg, MD  Semaglutide-Weight Management [facility]) 1 MG/0.5ML SOAJ Inject 1 mg into the skin once a week. Patient not taking: Reported on 10/03/2022 09/26/22   Biagio Borg, MD  valACYclovir (VALTREX) 500 MG tablet Take 1 tablet (500 mg total) by mouth 2 (two) times daily. Patient not taking: Reported on 10/03/2022 09/07/21   Biagio Borg, MD    Current Outpatient Medications  Medication Sig Dispense Refill   Cholecalciferol (VITAMIN D) 50 MCG (2000 UT) tablet Take 1 tablet (2,000 Units total) by mouth daily. 30 tablet 99   levothyroxine (SYNTHROID) 75 MCG tablet Take 1 tablet (75 mcg total) by mouth daily. 90 tablet 3   albuterol (VENTOLIN HFA) 108 (90 Base) MCG/ACT inhaler Inhale 2 puffs into the lungs every 6 (six) hours as needed for wheezing or shortness of breath. (Patient not taking: Reported  on 08/09/2022) 18 g 1   clobetasol (TEMOVATE) 0.05 % external solution Apply 1-7 times per week 50 mL 3   cyclobenzaprine (FLEXERIL) 5 MG tablet Take 1 tablet (5 mg total) by mouth 3 (three) times daily as needed for muscle spasms. (Patient not taking: Reported on 10/03/2022) 40 tablet 1   Fluticasone Furoate (ARNUITY ELLIPTA) 200 MCG/ACT AEPB 1 inhalation 1 time per day 30 each 5   meloxicam (MOBIC) 15 MG tablet Take 1 tablet (15 mg total) by mouth daily as needed for pain. (Patient not taking: Reported on 08/09/2022) 90 tablet 1   montelukast (SINGULAIR) 10 MG tablet Take 1 tablet (10 mg total) by mouth at bedtime. (Patient  not taking: Reported on 10/03/2022) 30 tablet 5   pantoprazole (PROTONIX) 40 MG tablet Take 1 tablet (40 mg total) by mouth daily. (Patient not taking: Reported on 10/03/2022) 90 tablet 3   Semaglutide-Weight Management (WEGOVY) 1 MG/0.5ML SOAJ Inject 1 mg into the skin once a week. (Patient not taking: Reported on 10/03/2022) 2 mL 11   valACYclovir (VALTREX) 500 MG tablet Take 1 tablet (500 mg total) by mouth 2 (two) times daily. (Patient not taking: Reported on 10/03/2022) 30 tablet 11   Current Facility-Administered Medications  Medication Dose Route Frequency Provider Last Rate Last Admin   0.9 %  sodium chloride infusion  500 mL Intravenous Continuous Tyree Fluharty V, DO        Allergies as of 10/03/2022   (No Known Allergies)    Family History  Problem Relation Age of Onset   Arthritis Mother    Heart disease Mother    Hypertension Mother    Diabetes Mother    Asthma Mother    Allergic rhinitis Mother    Alcohol abuse Father    Mental illness Father    Cancer Paternal Aunt        breast   Breast cancer Paternal Aunt    Asthma Sister    Colon cancer Neg Hx    Colon polyps Neg Hx    Esophageal cancer Neg Hx    Stomach cancer Neg Hx    Rectal cancer Neg Hx     Social History   Socioeconomic History   Marital status: Divorced    Spouse name: Not on file   Number of children: Not on file   Years of education: Not on file   Highest education level: Not on file  Occupational History   Not on file  Tobacco Use   Smoking status: Never   Smokeless tobacco: Never  Vaping Use   Vaping Use: Never used  Substance and Sexual Activity   Alcohol use: No    Alcohol/week: 0.0 standard drinks of alcohol   Drug use: No   Sexual activity: Yes    Birth control/protection: None    Comment: NUVA-RING  Other Topics Concern   Not on file  Social History Narrative   Not on file   Social Determinants of Health   Financial Resource Strain: Not on file  Food Insecurity: Not on  file  Transportation Needs: Not on file  Physical Activity: Not on file  Stress: Not on file  Social Connections: Not on file  Intimate Partner Violence: Not on file    Physical Exam: Vital signs in last 24 hours: '@BP'$  (!) 145/95   Pulse 86   Temp 98.4 F (36.9 C)   Ht '5\' 9"'$  (1.753 m)   Wt 231 lb (104.8 kg)   LMP 06/09/2019  SpO2 96%   BMI 34.11 kg/m  GEN: NAD EYE: Sclerae anicteric ENT: MMM CV: Non-tachycardic Pulm: CTA b/l GI: Soft, NT/ND NEURO:  Alert & Oriented x 3   Gerrit Heck, DO [facility]   10/03/2022 9:11 AM

## 2022-10-03 NOTE — Progress Notes (Signed)
0946 Robinul 0.1 mg IV given due large amount of secretions upon assessment.  MD made aware, vss  

## 2022-10-03 NOTE — Progress Notes (Signed)
Patient reports no changes to health or medications since office visit.  

## 2022-10-03 NOTE — Patient Instructions (Addendum)
Recommendation:Patient has a contact number available for                            emergencies. The signs and symptoms of potential                            delayed complications were discussed with the                            patient. Return to normal activities tomorrow.                            Written discharge instructions were provided to the                            patient.                           - Resume previous diet.                           - Continue present medications.                           - Await pathology results.                           - Perform a HIDA (hepatobiliary iminodiacetic acid)                            scan at appointment to be scheduled.  YOU HAD AN ENDOSCOPIC PROCEDURE TODAY AT [facility]:   Refer to the procedure report that was given to you for any specific questions about what was found during the examination.  If the procedure report does not answer your questions, please call your gastroenterologist to clarify.  If you requested that your care partner not be given the details of your procedure findings, then the procedure report has been included in a sealed envelope for you to review at your convenience later.  YOU SHOULD EXPECT: Some feelings of bloating in the abdomen. Passage of more gas than usual.  Walking can help get rid of the air that was put into your GI tract during the procedure and reduce the bloating. If you had a lower endoscopy (such as a colonoscopy or flexible sigmoidoscopy) you may notice spotting of blood in your stool or on the toilet paper. If you underwent a bowel prep for your procedure, you may not have a normal bowel movement for a few days.  Please Note:  You might notice some irritation and congestion in your nose or some drainage.  This is from the oxygen used during your procedure.  There is no need for concern and it should clear up in a day or so.  SYMPTOMS TO REPORT IMMEDIATELY: Following  upper endoscopy (EGD)  Vomiting of blood or coffee ground material  New chest pain or pain under the shoulder blades  Painful or persistently difficult swallowing  New shortness of breath  Fever of 100F or higher  Black, tarry-looking stools  For urgent or emergent issues, a  gastroenterologist can be reached at any hour by calling [phone]. Do not use MyChart messaging for urgent concerns.    DIET:  We do recommend a small meal at first, but then you may proceed to your regular diet.  Drink plenty of fluids but you should avoid alcoholic beverages for 24 hours.  ACTIVITY:  You should plan to take it easy for the rest of today and you should NOT DRIVE or use heavy machinery until tomorrow (because of the sedation medicines used during the test).    FOLLOW UP: Our staff will call the number listed on your records the next business day following your procedure.  We will call around 7:15- 8:00 am to check on you and address any questions or concerns that you may have regarding the information given to you following your procedure. If we do not reach you, we will leave a message.     If any biopsies were taken you will be contacted by phone or by letter within the next 1-3 weeks.  Please call us at [phone] if you have not heard about the biopsies in 3 weeks.    SIGNATURES/CONFIDENTIALITY: You and/or your care partner have signed paperwork which will be entered into your electronic medical record.  These signatures attest to the fact that that the information above on your After Visit Summary has been reviewed and is understood.  Full responsibility of the confidentiality of this discharge information lies with you and/or your care-partner.

## 2022-10-03 NOTE — Progress Notes (Signed)
Report given to PACU, vss 

## 2022-10-03 NOTE — Progress Notes (Signed)
Called to room to assist during endoscopic procedure.  Patient ID and intended procedure confirmed with present staff. Received instructions for my participation in the procedure from the performing physician.  

## 2022-10-04 ENCOUNTER — Telehealth: Payer: Self-pay | Admitting: *Deleted

## 2022-10-04 NOTE — Telephone Encounter (Signed)
No answer on  follow up call. Left message.   

## 2022-10-07 ENCOUNTER — Telehealth: Payer: Self-pay | Admitting: Internal Medicine

## 2022-10-07 NOTE — Telephone Encounter (Signed)
Patient said that her prior authorization for wygovey was denied. - Dr. Jenny Reichmann must send in a statement showing this drug is medically necessary.  Patient:  [phone]

## 2022-10-08 ENCOUNTER — Other Ambulatory Visit (HOSPITAL_COMMUNITY): Payer: Self-pay

## 2022-10-08 ENCOUNTER — Other Ambulatory Visit: Payer: Self-pay

## 2022-10-08 ENCOUNTER — Telehealth: Payer: Self-pay

## 2022-10-08 DIAGNOSIS — R1012 Left upper quadrant pain: Secondary | ICD-10-CM

## 2022-10-08 NOTE — Telephone Encounter (Signed)
E-appeal submitted through CoverMyMeds. This is on the 0.'5mg'$ , so we may have to redo the PA all together.

## 2022-10-08 NOTE — Telephone Encounter (Signed)
Order placed for HIDA scan as requested on 10/03/22 EGD report. Message sent to radiology scheduling.

## 2022-10-08 NOTE — Telephone Encounter (Signed)
Original PA was denied. Submitted Appeal through [facility] (Key: [id number]). Status Pending

## 2022-10-09 NOTE — Telephone Encounter (Addendum)
Patient called back with possible info needed for appeal she said.   Weight prior to drug: 253, Weighed back in early August 2023 Weight after using first dosage of drug: 230, Weighed In late October 2023

## 2022-10-14 ENCOUNTER — Encounter: Payer: Self-pay | Admitting: Gastroenterology

## 2022-10-18 ENCOUNTER — Encounter (HOSPITAL_COMMUNITY)
Admission: RE | Admit: 2022-10-18 | Discharge: 2022-10-18 | Disposition: A | Discharge status: Home / Self Care | Payer: BC Managed Care – PPO | Source: Ambulatory Visit | Attending: Gastroenterology | Admitting: Gastroenterology

## 2022-10-18 DIAGNOSIS — R1012 Left upper quadrant pain: Secondary | ICD-10-CM | POA: Diagnosis not present

## 2022-10-18 MED ORDER — TECHNETIUM TC 99M MEBROFENIN IV KIT
5.1000 | PACK | Freq: Once | INTRAVENOUS | Status: AC
  Administered 2022-10-18: 5.1 via INTRAVENOUS

## 2022-11-08 ENCOUNTER — Other Ambulatory Visit (HOSPITAL_COMMUNITY): Payer: Self-pay

## 2022-11-08 NOTE — Telephone Encounter (Signed)
Can you look into this regarding an appeal and get back with me

## 2022-11-08 NOTE — Telephone Encounter (Signed)
Pt states the appeal PW has not been received, please send forms again.  Pt is also requesting a call from Dr. Judi Cong nurse.   Please call at:  [phone]

## 2022-11-08 NOTE — Telephone Encounter (Signed)
Called insurance to check status. Insurance did not have appeal from [facility] on file. Will have to submit appeal because there was 2 denials on file. Asked for denial letter to be faxed in. Indexing denial letter to chart for directions on how to appeal.    Fax: [phone]

## 2022-11-12 NOTE — Telephone Encounter (Signed)
Pt has called checking on status of appeal and is wanting a call back with this update.

## 2022-11-28 NOTE — Telephone Encounter (Signed)
Patient called back asking for an update on the appeal, she would like a callback [phone].

## 2022-11-29 NOTE — Telephone Encounter (Signed)
PT calls back following up on this. PT stated that after talking to [facility] that she believed that it was denied due to item number 5 on the document. This item was referencing the PT's proof of weight loss from when she had started in May of last year to when she stopped receiving the medication in November.  In May (02/20/22 with Dr.John) PT was 243 lb at a BMI of 36 In November (08/09/22 with Dr.McGowen) PT was 230 lb at a BMI of 34.08.  CB for PT: [phone]

## 2022-11-29 NOTE — Telephone Encounter (Signed)
An appeal will have to be submitted due to 2 denials on file. Please be advised we currently do not have a Pharmacist to review denials, therefore you will need to process appeals accordingly Thanks for your support at this time.    Denial letter is in media

## 2023-01-22 ENCOUNTER — Ambulatory Visit
Admission: EM | Admit: 2023-01-22 | Discharge: 2023-01-22 | Disposition: A | Discharge status: Home / Self Care | Payer: BC Managed Care – PPO | Attending: Emergency Medicine | Admitting: Emergency Medicine

## 2023-01-22 DIAGNOSIS — B349 Viral infection, unspecified: Secondary | ICD-10-CM | POA: Diagnosis not present

## 2023-01-22 LAB — POCT RAPID STREP A (OFFICE): Rapid Strep A Screen: NEGATIVE

## 2023-01-22 MED ORDER — NYSTATIN 100000 UNIT/ML MT SUSP: 5.0000 mL | Freq: Four times a day (QID) | OROMUCOSAL | 0 refills | Status: DC | PRN

## 2023-01-22 MED ORDER — IPRATROPIUM BROMIDE 0.03 % NA SOLN: 2.0000 | Freq: Two times a day (BID) | NASAL | 12 refills | Status: DC

## 2023-01-22 NOTE — Discharge Instructions (Signed)
Your symptoms today are most likely being caused by a virus and should steadily improve in time it can take up to 7 to 10 days before you truly start to see a turnaround however things will get better  Strep testing is negative for bacteria  You may gargle and spit Magic mouthwash solution every 4-6 hours to provide temporary relief  You may use nasal spray every morning and every evening to help clear out the sinuses which may be contributing to your ear discomfort    You can take Tylenol and/or Ibuprofen as needed for fever reduction and pain relief.   For cough: honey 1/2 to 1 teaspoon (you can dilute the honey in water or another fluid).  You can also use guaifenesin and dextromethorphan for cough. You can use a humidifier for chest congestion and cough.  If you don't have a humidifier, you can sit in the bathroom with the hot shower running.      For sore throat: try warm salt water gargles, cepacol lozenges, throat spray, warm tea or water with lemon/honey, popsicles or ice, or OTC cold relief medicine for throat discomfort.   For congestion: take a daily anti-histamine like Zyrtec, Claritin, and a oral decongestant, such as pseudoephedrine.  You can also use Flonase 1-2 sprays in each nostril daily.   It is important to stay hydrated: drink plenty of fluids (water, gatorade/powerade/pedialyte, juices, or teas) to keep your throat moisturized and help further relieve irritation/discomfort.

## 2023-01-22 NOTE — ED Triage Notes (Signed)
Pt report she has had a sore throat, hurts to swallow, neck pain, and right ear pain x 4 days. Took tylenol which gave slight relief.

## 2023-01-22 NOTE — ED Provider Notes (Signed)
[facility]    CSN: [id number] Arrival date & time: 01/22/23  1756      History   Chief Complaint Chief Complaint  Patient presents with   Sore Throat    HPI Lauren Perkins is a 52 y.o. female.  Patient presents for evaluation of sore throat, and nonproductive cough.  Bilateral neck pain and right-sided ear pain present for 4 days.  Painful to swallow but tolerating food and liquids.  Known sick contact prior.  Has attempted use of Tylenol which has been somewhat helpful.  Denies fever chills or bodyaches, congestion.  Denies respiratory history.  Non-smoker.  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    Fibroids    Hypothyroidism     Patient Active Problem List   Diagnosis Date Noted   Epigastric pain 02/21/2022   Low back pain 02/02/2022   HLD (hyperlipidemia) 09/08/2021   Depression with anxiety 09/08/2021   Abnormal CXR 07/17/2020   Multifocal pneumonia 07/17/2020   Chest pain 03/31/2020   Asthma exacerbation 03/31/2020   Allergic rhinitis 03/31/2020   COVID-19 virus infection 10/29/2019   Fibroid uterus 09/02/2019   Vitamin D deficiency 07/14/2019   Hyperglycemia 07/14/2019   Encounter for well adult exam with abnormal findings 05/29/2018   Vertigo 05/01/2017   Perimenopause 08/14/2016   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 Authorizing Provider  albuterol (VENTOLIN HFA) 108 (90 Base) MCG/ACT inhaler Inhale 2 puffs into the lungs every 6 (six) hours as needed for wheezing or shortness of breath. Patient not taking: Reported on 08/09/2022 10/29/19   Corwin Levins, MD  Cholecalciferol (VITAMIN D) 50 MCG (2000 UT) tablet Take 1 tablet (2,000 Units total) by mouth daily. 09/07/21   Corwin Levins, MD  clobetasol (TEMOVATE) 0.05 % external solution Apply 1-7 times per week 04/21/20   Kozlow, Alvira Philips, MD  cyclobenzaprine (FLEXERIL) 5 MG tablet Take 1 tablet (5 mg total) by mouth 3 (three) times daily as needed for muscle spasms. Patient not taking: Reported on 10/03/2022 01/28/22   Corwin Levins, MD  Fluticasone Furoate (ARNUITY ELLIPTA) 200 MCG/ACT AEPB 1 inhalation 1 time per day 04/19/20   Jessica Priest, MD  levothyroxine (SYNTHROID) 75 MCG tablet Take 1 tablet (75 mcg total) by mouth daily. 01/28/22   Corwin Levins, MD  meloxicam (MOBIC) 15 MG tablet Take 1 tablet (15 mg total) by mouth daily as needed for pain.  Patient not taking: Reported on 08/09/2022 01/28/22   Corwin Levins, MD  montelukast (SINGULAIR) 10 MG tablet Take 1 tablet (10 mg total) by mouth at bedtime. Patient not taking: Reported on 10/03/2022 04/19/20   Jessica Priest, MD  pantoprazole (PROTONIX) 40 MG tablet Take 1 tablet (40 mg total) by mouth daily. Patient not taking: Reported on 10/03/2022 02/20/22   Corwin Levins, MD  Semaglutide-Weight Management [facility]) 1 MG/0.5ML SOAJ Inject 1 mg into the skin once a week. Patient not taking: Reported on 10/03/2022 09/26/22   Corwin Levins, MD  valACYclovir (VALTREX) 500 MG tablet Take 1 tablet (500 mg total) by mouth 2 (two) times daily. Patient not taking: Reported on 10/03/2022 09/07/21   Corwin Levins, MD    Family History Family History  Problem Relation Age of Onset   Arthritis Mother    Heart disease Mother    Hypertension  Mother    Diabetes Mother    Asthma Mother    Allergic rhinitis Mother    Alcohol abuse Father    Mental illness Father    Cancer Paternal Aunt        breast   Breast cancer Paternal Aunt    Asthma Sister    Colon cancer Neg Hx    Colon polyps Neg Hx    Esophageal cancer Neg Hx    Stomach cancer Neg Hx    Rectal cancer Neg Hx     Social History Social History   Tobacco Use   Smoking status: Never   Smokeless tobacco: Never  Vaping Use   Vaping Use: Never used  Substance Use Topics   Alcohol use: No    Alcohol/week: 0.0 standard drinks of alcohol   Drug use: No     Allergies   Patient has no known allergies.   Review of Systems Review of Systems  Constitutional: Negative.   HENT:  Positive for ear pain and sore throat. Negative for congestion, dental problem, drooling, ear discharge, facial swelling, hearing loss, mouth sores, nosebleeds, postnasal drip, rhinorrhea, sinus pressure, sinus pain, sneezing, tinnitus, trouble swallowing and voice change.   Respiratory:  Positive for cough. Negative for apnea, choking, chest tightness, shortness of breath, wheezing and stridor.   Cardiovascular: Negative.   Gastrointestinal: Negative.      Physical Exam Triage Vital Signs ED Triage Vitals  Enc Vitals Group     BP 01/22/23 1803 (!) 152/94     Pulse Rate 01/22/23 1803 91     Resp 01/22/23 1803 18     Temp 01/22/23 1803 (!) 97.4 F (36.3 C)     Temp Source 01/22/23 1803 Oral     SpO2 01/22/23 1803 95 %     Weight --      Height --      Head Circumference --      Peak Flow --      Pain Score 01/22/23 1804 5     Pain Loc --      Pain Edu? --      Excl. in GC? --    No data found.  Updated Vital Signs BP (!) 152/94 (BP Location: Left Arm)   Pulse 91   Temp (!) 97.4 F (36.3 C) (Oral)   Resp 18   LMP 06/09/2019   SpO2 95%   Visual Acuity Right Eye Distance:   Left Eye Distance:   Bilateral Distance:    Right Eye Near:   Left Eye Near:    Bilateral  Near:     Physical Exam Constitutional:      Appearance: She is well-developed.  HENT:     Head: Normocephalic.     Right Ear: Tympanic membrane and ear canal normal.     Left Ear: Tympanic membrane and ear canal normal.     Nose: Congestion present.     Mouth/Throat:     Mouth: Mucous membranes are moist.     Pharynx: No posterior oropharyngeal erythema.     Tonsils: No tonsillar exudate. 1+ on the right. 1+ on the left.  Cardiovascular:     Rate and Rhythm: Normal rate and regular rhythm.     Heart sounds: Normal heart sounds.  Pulmonary:     Effort: Pulmonary effort is normal.     Breath sounds: Normal breath sounds.  Musculoskeletal:     Cervical back: Neck supple.  Skin:    General: Skin is warm and dry.  Neurological:     Mental Status: She is alert.      UC Treatments / Results  Labs (all labs ordered are listed, but only abnormal results are displayed) Labs Reviewed  POCT RAPID STREP A (OFFICE)    EKG   Radiology No results found.  Procedures Procedures (including critical care time)  Medications Ordered in UC Medications - No data to display  Initial Impression / Assessment and Plan / UC Course  I have reviewed the triage vital signs and the nursing notes.  Pertinent labs & imaging results that were available during my care of the patient were reviewed by me and considered in my medical decision making (see chart for details).  Viral illness  Patient is in no signs of distress nor toxic appearing.  Vital signs are stable.  Low suspicion for pneumonia, pneumothorax or bronchitis and therefore will defer imaging.  Testing negative.  Prescribed Magic mouthwash and Atrovent nasal spray.May use additional over-the-counter medications as needed for supportive care.  May follow-up with urgent care as needed if symptoms persist or worsen.  Note given.    Final Clinical Impressions(s) / UC Diagnoses   Final diagnoses:  None   Discharge Instructions    None    ED Prescriptions   None    PDMP not reviewed this encounter.   Valinda Hoar, [place] 01/22/23 [phone]

## 2023-01-23 ENCOUNTER — Telehealth: Payer: Self-pay

## 2023-01-23 MED ORDER — NYSTATIN 100000 UNIT/ML MT SUSP: 5.0000 mL | Freq: Four times a day (QID) | OROMUCOSAL | 0 refills | Status: DC | PRN

## 2023-02-04 ENCOUNTER — Telehealth: Payer: Self-pay | Admitting: Internal Medicine

## 2023-02-04 ENCOUNTER — Other Ambulatory Visit: Payer: Self-pay

## 2023-02-04 MED ORDER — LEVOTHYROXINE SODIUM 75 MCG PO TABS: 75.0000 ug | ORAL_TABLET | Freq: Every day | ORAL | 3 refills | Status: DC

## 2023-02-04 NOTE — Telephone Encounter (Signed)
Prescription Request  02/04/2023  LOV: 02/20/2022  What is the name of the medication or equipment?  levothyroxine (SYNTHROID) 75 MCG tablet  Have you contacted your pharmacy to request a refill? No   Which pharmacy would you like this sent to?  [id number] - [place], [place] - [address] [place] [address] Phone: [phone] Fax: [phone]   Patient notified that their request is being sent to the clinical staff for review and that they should receive a response within 2 business days.   Please advise at Mobile [phone] (mobile)    Next OV is 02/13/2023

## 2023-02-04 NOTE — Telephone Encounter (Signed)
Refill Sent. 

## 2023-02-13 ENCOUNTER — Encounter: Payer: Self-pay | Admitting: Internal Medicine

## 2023-02-13 ENCOUNTER — Ambulatory Visit (INDEPENDENT_AMBULATORY_CARE_PROVIDER_SITE_OTHER): Payer: BC Managed Care – PPO | Admitting: Internal Medicine

## 2023-02-13 VITALS — BP 180/140 | HR 82 | Temp 98.4°F | Ht 69.0 in | Wt 245.0 lb

## 2023-02-13 DIAGNOSIS — I10 Essential (primary) hypertension: Secondary | ICD-10-CM | POA: Diagnosis not present

## 2023-02-13 DIAGNOSIS — E538 Deficiency of other specified B group vitamins: Secondary | ICD-10-CM | POA: Diagnosis not present

## 2023-02-13 DIAGNOSIS — E039 Hypothyroidism, unspecified: Secondary | ICD-10-CM | POA: Diagnosis not present

## 2023-02-13 DIAGNOSIS — Z6834 Body mass index (BMI) 34.0-34.9, adult: Secondary | ICD-10-CM

## 2023-02-13 DIAGNOSIS — R739 Hyperglycemia, unspecified: Secondary | ICD-10-CM

## 2023-02-13 DIAGNOSIS — Z0001 Encounter for general adult medical examination with abnormal findings: Secondary | ICD-10-CM

## 2023-02-13 DIAGNOSIS — E559 Vitamin D deficiency, unspecified: Secondary | ICD-10-CM | POA: Diagnosis not present

## 2023-02-13 DIAGNOSIS — E669 Obesity, unspecified: Secondary | ICD-10-CM | POA: Diagnosis not present

## 2023-02-13 LAB — URINALYSIS, ROUTINE W REFLEX MICROSCOPIC
Bilirubin Urine: NEGATIVE
Ketones, ur: NEGATIVE
Leukocytes,Ua: NEGATIVE
Nitrite: NEGATIVE
Specific Gravity, Urine: 1.02 (ref 1.000–1.030)
Total Protein, Urine: NEGATIVE
Urine Glucose: NEGATIVE
Urobilinogen, UA: 0.2 (ref 0.0–1.0)
pH: 7 (ref 5.0–8.0)

## 2023-02-13 LAB — CBC WITH DIFFERENTIAL/PLATELET
Basophils Absolute: 0 10*3/uL (ref 0.0–0.1)
Basophils Relative: 0.7 % (ref 0.0–3.0)
Eosinophils Absolute: 0.1 10*3/uL (ref 0.0–0.7)
Eosinophils Relative: 1.2 % (ref 0.0–5.0)
HCT: 41 % (ref 36.0–46.0)
Hemoglobin: 13.4 g/dL (ref 12.0–15.0)
Lymphocytes Relative: 44.5 % (ref 12.0–46.0)
Lymphs Abs: 2.3 10*3/uL (ref 0.7–4.0)
MCHC: 32.8 g/dL (ref 30.0–36.0)
MCV: 92.6 fl (ref 78.0–100.0)
Monocytes Absolute: 0.5 10*3/uL (ref 0.1–1.0)
Monocytes Relative: 8.9 % (ref 3.0–12.0)
Neutro Abs: 2.3 10*3/uL (ref 1.4–7.7)
Neutrophils Relative %: 44.7 % (ref 43.0–77.0)
Platelets: 220 10*3/uL (ref 150.0–400.0)
RBC: 4.42 Mil/uL (ref 3.87–5.11)
RDW: 13.4 % (ref 11.5–15.5)
WBC: 5.1 10*3/uL (ref 4.0–10.5)

## 2023-02-13 LAB — LIPID PANEL
Cholesterol: 158 mg/dL (ref 0–200)
HDL: 34 mg/dL — ABNORMAL LOW (ref >39.00)
LDL Cholesterol: 99 mg/dL (ref 0–99)
NonHDL: 123.82
Total CHOL/HDL Ratio: 5
Triglycerides: 122 mg/dL (ref 0.0–149.0)
VLDL: 24.4 mg/dL (ref 0.0–40.0)

## 2023-02-13 LAB — BASIC METABOLIC PANEL
BUN: 9 mg/dL (ref 6–23)
CO2: 29 mEq/L (ref 19–32)
Calcium: 9.6 mg/dL (ref 8.4–10.5)
Chloride: 104 mEq/L (ref 96–112)
Creatinine, Ser: 0.98 mg/dL (ref 0.40–1.20)
GFR: 65.83 mL/min (ref >60.00)
Glucose, Bld: 84 mg/dL (ref 70–99)
Potassium: 3.9 mEq/L (ref 3.5–5.1)
Sodium: 140 mEq/L (ref 135–145)

## 2023-02-13 LAB — HEMOGLOBIN A1C: Hgb A1c MFr Bld: 6.5 % (ref 4.6–6.5)

## 2023-02-13 LAB — VITAMIN D 25 HYDROXY (VIT D DEFICIENCY, FRACTURES): VITD: 29.79 ng/mL — ABNORMAL LOW (ref 30.00–100.00)

## 2023-02-13 LAB — HEPATIC FUNCTION PANEL
ALT: 25 U/L (ref 0–35)
AST: 20 U/L (ref 0–37)
Albumin: 4.3 g/dL (ref 3.5–5.2)
Alkaline Phosphatase: 50 U/L (ref 39–117)
Bilirubin, Direct: 0.1 mg/dL (ref 0.0–0.3)
Total Bilirubin: 0.3 mg/dL (ref 0.2–1.2)
Total Protein: 7.6 g/dL (ref 6.0–8.3)

## 2023-02-13 LAB — T4, FREE: Free T4: 0.62 ng/dL (ref 0.60–1.60)

## 2023-02-13 LAB — TSH: TSH: 1.41 u[IU]/mL (ref 0.35–5.50)

## 2023-02-13 LAB — VITAMIN B12: Vitamin B-12: 341 pg/mL (ref 211–911)

## 2023-02-13 MED ORDER — HYDROCHLOROTHIAZIDE 12.5 MG PO CAPS: 12.5000 mg | ORAL_CAPSULE | Freq: Every day | ORAL | 3 refills | Status: DC

## 2023-02-13 MED ORDER — WEGOVY 0.5 MG/0.5ML ~~LOC~~ SOAJ: 0.5000 mg | SUBCUTANEOUS | 11 refills | Status: DC

## 2023-02-13 NOTE — Progress Notes (Signed)
The test results show that your current treatment is OK, as the tests are stable.  Please continue the same plan.  There is no other need for change of treatment or further evaluation based on these results, at this time.  thanks 

## 2023-02-13 NOTE — Progress Notes (Signed)
Patient ID: Lauren Perkins, female   DOB: 1971/08/17, 52 y.o.   MRN: [id number]         Chief Complaint:: wellness exam and htn, obeisty, low thyroid, low vit d, hyperglycemia       HPI:  Lauren Perkins is a 52 y.o. female here for wellness exam; for shingrix at pharmacy, pt to call for gyn pap smear herself, o/w up to date               Also Pt denies chest pain, increased sob or doe, wheezing, orthopnea, PND, increased LE swelling, palpitations, dizziness or syncope.   Pt denies polydipsia, polyuria, or new focal neuro s/s.    Pt denies fever, wt loss, night sweats, loss of appetite, or other constitutional symptoms  Still unable to lose wt , did lose with wegovy but now out, asks for restart.     Wt Readings from Last 3 Encounters:  02/13/23 245 lb (111.1 kg)  10/03/22 231 lb (104.8 kg)  08/27/22 231 lb 4 oz (104.9 kg)   BP Readings from Last 3 Encounters:  02/13/23 (!) 180/140  01/22/23 (!) 152/94  10/03/22 133/89   Immunization History  Administered Date(s) Administered   PFIZER(Purple Top)SARS-COV-2 Vaccination 04/12/2020, 05/05/2020   Tdap 06/14/2015   Health Maintenance Due  Topic Date Due   Zoster Vaccines- Shingrix (1 of 2) Never done   PAP SMEAR-Modifier  09/01/2022      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    Fibroids    Hypothyroidism    Past Surgical History:  Procedure Laterality Date   BREAST LUMPECTOMY Left [id number]   atipical hyperplasia   EYE SURGERY     right eye when she was 3    reports that she has never smoked. She has never used smokeless tobacco. She reports that she does not drink alcohol and does not use drugs. family history includes Alcohol abuse in her father; Allergic rhinitis in her mother; Arthritis in her mother; Asthma in her mother and sister; Breast cancer in her paternal aunt; Cancer in her paternal aunt; Diabetes in her mother; Heart  disease in her mother; Hypertension in her mother; Mental illness in her father. No Known Allergies Current Outpatient Medications on File Prior to Visit  Medication Sig Dispense Refill   albuterol (VENTOLIN HFA) 108 (90 Base) MCG/ACT inhaler Inhale 2 puffs into the lungs every 6 (six) hours as needed for wheezing or shortness of breath. 18 g 1   Cholecalciferol (VITAMIN D) 50 MCG (2000 UT) tablet Take 1 tablet (2,000 Units total) by mouth daily. 30 tablet 99   clobetasol (TEMOVATE) 0.05 % external solution Apply 1-7 times per week 50 mL 3   Fluticasone Furoate (ARNUITY ELLIPTA) 200 MCG/ACT AEPB 1 inhalation 1 time per day 30 each 5   ipratropium (ATROVENT) 0.03 % nasal spray Place 2 sprays into both nostrils every 12 (twelve) hours. 30 mL 12   levothyroxine (SYNTHROID) 75 MCG tablet Take 1 tablet (75 mcg total) by mouth daily. 90 tablet 3   magic mouthwash (nystatin, lidocaine, diphenhydrAMINE, alum & mag hydroxide) suspension Swish and spit 5 mLs 4 (four) times daily as needed for mouth pain. 180 mL 0   meloxicam (MOBIC) 15 MG tablet Take 1 tablet (15 mg total) by mouth daily as needed for pain. 90 tablet 1   montelukast (SINGULAIR) 10 MG  tablet Take 1 tablet (10 mg total) by mouth at bedtime. 30 tablet 5   valACYclovir (VALTREX) 500 MG tablet Take 1 tablet (500 mg total) by mouth 2 (two) times daily. 30 tablet 11   No current facility-administered medications on file prior to visit.        ROS:  All others reviewed and negative.  Objective        PE:  BP (!) 180/140 (BP Location: Left Arm, Patient Position: Sitting, Cuff Size: Normal)   Pulse 82   Temp 98.4 F (36.9 C) (Oral)   Ht 5\' 9"  (1.753 m)   Wt 245 lb (111.1 kg)   LMP 06/09/2019   SpO2 96%   BMI 36.18 kg/m                 Constitutional: Pt appears in NAD               HENT: Head: NCAT.                Right Ear: External ear normal.                 Left Ear: External ear normal.                Eyes: . Pupils are equal,  round, and reactive to light. Conjunctivae and EOM are normal               Nose: without d/c or deformity               Neck: Neck supple. Gross normal ROM               Cardiovascular: Normal rate and regular rhythm.                 Pulmonary/Chest: Effort normal and breath sounds without rales or wheezing.                Abd:  Soft, NT, ND, + BS, no organomegaly               Neurological: Pt is alert. At baseline orientation, motor grossly intact               Skin: Skin is warm. No rashes, no other new lesions, LE edema - none               Psychiatric: Pt behavior is normal without agitation   Micro: none  Cardiac tracings I have personally interpreted today:  none  Pertinent Radiological findings (summarize): none   Lab Results  Component Value Date   WBC 5.1 02/13/2023   HGB 13.4 02/13/2023   HCT 41.0 02/13/2023   PLT 220.0 02/13/2023   GLUCOSE 84 02/13/2023   CHOL 158 02/13/2023   TRIG 122.0 02/13/2023   HDL 34.00 (L) 02/13/2023   LDLCALC 99 02/13/2023   ALT 25 02/13/2023   AST 20 02/13/2023   NA 140 02/13/2023   K 3.9 02/13/2023   CL 104 02/13/2023   CREATININE 0.98 02/13/2023   BUN 9 02/13/2023   CO2 29 02/13/2023   TSH 1.41 02/13/2023   HGBA1C 6.5 02/13/2023   Assessment/Plan:  Lauren Perkins is a 52 y.o. Black or African American [2] female with  has a past medical history of Allergy, Cancer (HCC), Fibroids, and Hypothyroidism.  Encounter for well adult exam with abnormal findings Age and sex appropriate education and counseling updated with regular exercise and diet Referrals for preventative services - pt to  call for gyn pap smear Immunizations addressed - for [facility]ingrix [facility] Smoking counseling  - none needed Evidence for depression or other mood disorder - none significant Most recent labs reviewed. I have personally reviewed and have noted: 1) the patient's medical and social history 2) The patient's current medications and supplements 3)  The patient's height, weight, and BMI have been recorded in the chart   Hypothyroidism Lab Results  Component Value Date   TSH 1.41 02/13/2023   Stable, pt to continue levothyroxine 75 mcg qd  Essential hypertension BP Readings from Last 3 Encounters:  02/13/23 (!) 180/140  01/22/23 (!) 152/94  10/03/22 133/89   uncontrolled, pt to start hct 12.5 qd   Vitamin D deficiency Last vitamin D Lab Results  Component Value Date   VD25OH 29.79 (L) 02/13/2023   Low, to start oral replacement   Hyperglycemia Lab Results  Component Value Date   HGBA1C 6.5 02/13/2023   Stable, pt to continue current medical treatment  - diet, wt control   Obesity Ok to restart wegovy Followup: Return in about 1 year (around 02/13/2024).  Oliver Barre, MD 02/15/2023 7:04 PM [facility]

## 2023-02-13 NOTE — Patient Instructions (Addendum)
Please have your Shingrix (shingles) shots done at your local pharmacy.  Please take all new medication as prescribed - the hct 12.5 mg per day for blood pressure  Please continue all other medications as before, and refills have been done if requested to restart the Wegovy 0.5 mg  Please call in 1 month for the 1 mg if you do well with the 0.5 mg  Please have the pharmacy call with any other refills you may need.  Please continue your efforts at being more active, low cholesterol diet, and weight control.  You are otherwise up to date with prevention measures today.  Please keep your appointments with your specialists as you may have planned  Please go to the LAB at the blood drawing area for the tests to be done  You will be contacted by phone if any changes need to be made immediately.  Otherwise, you will receive a letter about your results with an explanation, but please check with MyChart first.  Please remember to sign up for MyChart if you have not done so, as this will be important to you in the future with finding out test results, communicating by private email, and scheduling acute appointments online when needed.  Please make an Appointment to return for your 1 year visit, or sooner if needed

## 2023-02-14 ENCOUNTER — Telehealth: Payer: Self-pay | Admitting: Internal Medicine

## 2023-02-14 NOTE — Telephone Encounter (Signed)
Patient would like to know if we have wygovey coupons.  Please call patient  Phone:  [phone]

## 2023-02-15 ENCOUNTER — Encounter: Payer: Self-pay | Admitting: Internal Medicine

## 2023-02-15 NOTE — Assessment & Plan Note (Signed)
Lab Results  Component Value Date   HGBA1C 6.5 02/13/2023   Stable, pt to continue current medical treatment  - diet, wt control

## 2023-02-15 NOTE — Assessment & Plan Note (Signed)
Last vitamin D Lab Results  Component Value Date   VD25OH 29.79 (L) 02/13/2023   Low, to start oral replacement

## 2023-02-15 NOTE — Assessment & Plan Note (Signed)
BP Readings from Last 3 Encounters:  02/13/23 (!) 180/140  01/22/23 (!) 152/94  10/03/22 133/89   uncontrolled, pt to start hct 12.5 qd

## 2023-02-15 NOTE — Assessment & Plan Note (Signed)
Age and sex appropriate education and counseling updated with regular exercise and diet Referrals for preventative services - pt to call for gyn pap smear Immunizations addressed - for shingrix at[facility] pharmacy Smoking counseling  - none needed Evidence for depression or other mood disorder - none significant Most recent labs reviewed. I have personally reviewed and have noted: 1) the patient's medical and social history 2) The patient's current medications and supplements 3) The patient's height, weight, and BMI have been recorded in the chart

## 2023-02-15 NOTE — Assessment & Plan Note (Signed)
Ok to restart [employer]

## 2023-02-15 NOTE — Assessment & Plan Note (Signed)
Lab Results  Component Value Date   TSH 1.41 02/13/2023   Stable, pt to continue levothyroxine 75 mcg qd

## 2023-09-29 ENCOUNTER — Ambulatory Visit: Payer: Self-pay | Admitting: Internal Medicine

## 2023-09-29 NOTE — Telephone Encounter (Signed)
 Copied from CRM [id number]. Topic: Clinical - Pink Word Triage >> Sep 29, 2023  4:01 PM Isabell A wrote: Reason for Triage: Patient requesting an antibiotic. Currently experiencing earache, low grade fever, a lot of mucus, tight chest, coughing, sneezing & headache.     1st attempt to call patient, no answer, LVM

## 2023-09-29 NOTE — Telephone Encounter (Signed)
 Copied from CRM [id number]. Topic: Clinical - Pink Word Triage >> Sep 29, 2023  4:01 PM Lauren Perkins wrote: Reason for Triage: Patient requesting an antibiotic. Currently experiencing earache, low grade fever, Perkins lot of mucus, tight chest, coughing, sneezing & headache.   Chief Complaint:  Upper respiratory symptoms Symptoms: Cough, fever, nasal congestion, bilateral ear pain, mild chest tightness with cough  Frequency: Frequent  Pertinent Negatives: Patient denies shortness of breath  Disposition: [] ED /[] Urgent Care (no appt availability in office) / [x] Appointment(In office/virtual)/ []  [facility]/ [] Home Care/ [] Refused Recommended Disposition /[] [facility]/ []  Follow-up with PCP Additional Notes: Patient reprots upper respiratory symptoms for the last 2-3 days. Patient states she has been experiencing Perkins cough that is productive of clear sputum, rhinorrhea, chest tightness with cough, bilateral ear pain, and fever. She states her fever has been between 37 F and 101 F. She denies any shortness of breath. Appointment made for patient. Patient advised to call back for any new or worsening symptoms between now and her appointment. Patient verbalized understanding.     Reason for Disposition  Earache  Answer Assessment - Initial Assessment Questions 1. ONSET: When did the cough begin?      2-3 days 2. SEVERITY: How bad is the cough today?      Mild to moderate 3. SPUTUM: Describe the color of your sputum (none, dry cough; clear, white, yellow, green)     Clear 4. HEMOPTYSIS: Are you coughing up any blood? If so ask: How much? (flecks, streaks, tablespoons, etc.)     No 5. DIFFICULTY BREATHING: Are you having difficulty breathing? If Yes, ask: How bad is it? (e.g., mild, moderate, severe)    - MILD: No SOB at rest, mild SOB with walking, speaks normally in sentences, can lie down, no retractions, pulse < 100.    - MODERATE: SOB at rest, SOB with minimal  exertion and prefers to sit, cannot lie down flat, speaks in phrases, mild retractions, audible wheezing, pulse 100-120.    - SEVERE: Very SOB at rest, speaks in single words, struggling to breathe, sitting hunched forward, retractions, pulse > 120      No 6. FEVER: Do you have Perkins fever? If Yes, ask: What is your temperature, how was it measured, and when did it start?     Yes, 99.9 to 101 F 7. CARDIAC HISTORY: Do you have any history of heart disease? (e.g., heart attack, congestive heart failure)      No 8. LUNG HISTORY: Do you have any history of lung disease?  (e.g., pulmonary embolus, asthma, emphysema)     Post COVID asthma  9. PE RISK FACTORS: Do you have Perkins history of blood clots? (or: recent major surgery, recent prolonged travel, bedridden)     No 10. OTHER SYMPTOMS: Do you have any other symptoms? (e.g., runny nose, wheezing, chest pain)       Runny nose, fever, congestion, bilateral earache, tightness in chest, post nasal drip, sore throat, headache  11. PREGNANCY: Is there any chance you are pregnant? When was your last menstrual period?       No 12. TRAVEL: Have you traveled out of the country in the last month? (e.g., travel history, exposures)       No  Protocols used: Cough - Acute Productive-Perkins-AH

## 2023-10-01 ENCOUNTER — Encounter: Payer: Self-pay | Admitting: Emergency Medicine

## 2023-10-01 ENCOUNTER — Ambulatory Visit (INDEPENDENT_AMBULATORY_CARE_PROVIDER_SITE_OTHER): Payer: 59 | Admitting: Emergency Medicine

## 2023-10-01 VITALS — BP 152/92 | HR 79 | Temp 98.4°F | Ht 69.0 in | Wt 236.8 lb

## 2023-10-01 DIAGNOSIS — R0981 Nasal congestion: Secondary | ICD-10-CM | POA: Diagnosis not present

## 2023-10-01 DIAGNOSIS — R6889 Other general symptoms and signs: Secondary | ICD-10-CM | POA: Diagnosis not present

## 2023-10-01 DIAGNOSIS — J0100 Acute maxillary sinusitis, unspecified: Secondary | ICD-10-CM | POA: Diagnosis not present

## 2023-10-01 MED ORDER — AZITHROMYCIN 250 MG PO TABS
ORAL_TABLET | ORAL | 0 refills | Status: DC
Start: 2023-10-01 — End: 2024-03-31

## 2023-10-01 NOTE — Patient Instructions (Signed)

## 2023-10-01 NOTE — Progress Notes (Signed)
 Lauren Perkins 53 y.o.   Chief Complaint  Patient presents with   Cough    Low grade fever, cough, sinus, ear ache, started 2-3 days ago    HISTORY OF PRESENT ILLNESS: Acute problem visit today.  Patient of Dr. Lynwood Rush This is a 53 y.o. female complaining of flulike symptoms that started about 3 weeks ago Still has persistent nasal and chest congestion with cough and low-grade fever No other associated symptoms No other complaints or medical concerns today.  Cough Associated symptoms include a fever. Pertinent negatives include no chest pain, headaches or rash.     Prior to Admission medications   Medication Sig Start Date End Date Taking? Authorizing Provider  albuterol  (VENTOLIN  HFA) 108 (90 Base) MCG/ACT inhaler Inhale 2 puffs into the lungs every 6 (six) hours as needed for wheezing or shortness of breath. 10/29/19  Yes Rush Lynwood ORN, MD  Cholecalciferol (VITAMIN D ) 50 MCG (2000 UT) tablet Take 1 tablet (2,000 Units total) by mouth daily. 09/07/21  Yes Rush Lynwood ORN, MD  clobetasol  (TEMOVATE ) 0.05 % external solution Apply 1-7 times per week 04/21/20  Yes Kozlow, Camellia PARAS, MD  Fluticasone Furoate  (ARNUITY ELLIPTA ) 200 MCG/ACT AEPB 1 inhalation 1 time per day 04/19/20  Yes Kozlow, Camellia PARAS, MD  hydrochlorothiazide  (MICROZIDE ) 12.5 MG capsule Take 1 capsule (12.5 mg total) by mouth daily. 02/13/23  Yes Rush Lynwood ORN, MD  levothyroxine  (SYNTHROID ) 75 MCG tablet Take 1 tablet (75 mcg total) by mouth daily. 02/04/23  Yes Rush Lynwood ORN, MD  montelukast  (SINGULAIR ) 10 MG tablet Take 1 tablet (10 mg total) by mouth at bedtime. 04/19/20  Yes Kozlow, Camellia PARAS, MD  valACYclovir  (VALTREX ) 500 MG tablet Take 1 tablet (500 mg total) by mouth 2 (two) times daily. 09/07/21  Yes Rush Lynwood ORN, MD  ipratropium (ATROVENT ) 0.03 % nasal spray Place 2 sprays into both nostrils every 12 (twelve) hours. Patient not taking: Reported on 10/01/2023 01/22/23   Teresa Shelba SAUNDERS, NP  magic mouthwash (nystatin ,  lidocaine, diphenhydrAMINE, alum & mag hydroxide) suspension Swish and spit 5 mLs 4 (four) times daily as needed for mouth pain. Patient not taking: Reported on 10/01/2023 01/23/23   Vonna Sharlet POUR, MD  meloxicam  (MOBIC ) 15 MG tablet Take 1 tablet (15 mg total) by mouth daily as needed for pain. Patient not taking: Reported on 10/01/2023 01/28/22   Rush Lynwood ORN, MD  Semaglutide -Weight Management (WEGOVY ) 0.5 MG/0.5ML SOAJ Inject 0.5 mg into the skin once a week. Patient not taking: Reported on 10/01/2023 02/13/23   Rush Lynwood ORN, MD    No Known Allergies  Patient Active Problem List   Diagnosis Date Noted   Epigastric pain 02/21/2022   Low back pain 02/02/2022   HLD (hyperlipidemia) 09/08/2021   Depression with anxiety 09/08/2021   Abnormal CXR 07/17/2020   Multifocal pneumonia 07/17/2020   Chest pain 03/31/2020   Asthma exacerbation 03/31/2020   Allergic rhinitis 03/31/2020   COVID-19 virus infection 10/29/2019   Fibroid uterus 09/02/2019   Vitamin D  deficiency 07/14/2019   Hyperglycemia 07/14/2019   Encounter for well adult exam with abnormal findings 05/29/2018   Vertigo 05/01/2017   Perimenopause 08/14/2016   Atypical ductal hyperplasia of left breast 11/28/2015   Hypothyroidism 06/14/2015   Essential hypertension 06/14/2015   DCIS (ductal carcinoma in situ) 06/14/2015   Obesity 06/14/2015   Genital herpes 04/13/2014    Past Medical History:  Diagnosis Date   Allergy    seasonal- occ Zyrtec  Cancer (HCC)    atypical hyper plasia left breast    Fibroids    Hypothyroidism     Past Surgical History:  Procedure Laterality Date   BREAST LUMPECTOMY Left [id number]   atipical hyperplasia   EYE SURGERY     right eye when she was 3    Social History   Socioeconomic History   Marital status: Divorced    Spouse name: Not on file   Number of children: Not on file   Years of education: Not on file   Highest education level: Not on file  Occupational History   Not on  file  Tobacco Use   Smoking status: Never   Smokeless tobacco: Never  Vaping Use   Vaping status: Never Used  Substance and Sexual Activity   Alcohol use: No    Alcohol/week: 0.0 standard drinks of alcohol   Drug use: No   Sexual activity: Yes    Birth control/protection: None    Comment: NUVA-RING  Other Topics Concern   Not on file  Social History Narrative   Not on file   Social Drivers of Health   Financial Resource Strain: Not on file  Food Insecurity: Not on file  Transportation Needs: Not on file  Physical Activity: Not on file  Stress: Not on file  Social Connections: Not on file  Intimate Partner Violence: Not on file    Family History  Problem Relation Age of Onset   Arthritis Mother    Heart disease Mother    Hypertension Mother    Diabetes Mother    Asthma Mother    Allergic rhinitis Mother    Alcohol abuse Father    Mental illness Father    Cancer Paternal Aunt        breast   Breast cancer Paternal Aunt    Asthma Sister    Colon cancer Neg Hx    Colon polyps Neg Hx    Esophageal cancer Neg Hx    Stomach cancer Neg Hx    Rectal cancer Neg Hx      Review of Systems  Constitutional:  Positive for fever and malaise/fatigue.  HENT:  Positive for congestion.   Respiratory:  Positive for cough.   Cardiovascular: Negative.  Negative for chest pain and palpitations.  Gastrointestinal:  Negative for abdominal pain, diarrhea, nausea and vomiting.  Genitourinary: Negative.  Negative for dysuria and hematuria.  Skin:  Negative for rash.  Neurological: Negative.  Negative for dizziness and headaches.    Vitals:   10/01/23 1456  BP: (!) 152/92  Pulse: 79  Temp: 98.4 F (36.9 C)  SpO2: 96%    Physical Exam Vitals reviewed.  Constitutional:      Appearance: Normal appearance.  HENT:     Head: Normocephalic.     Right Ear: Tympanic membrane, ear canal and external ear normal.     Left Ear: Tympanic membrane, ear canal and external ear normal.      Mouth/Throat:     Mouth: Mucous membranes are moist.     Pharynx: Oropharynx is clear.  Eyes:     Extraocular Movements: Extraocular movements intact.     Conjunctiva/sclera: Conjunctivae normal.     Pupils: Pupils are equal, round, and reactive to light.  Cardiovascular:     Rate and Rhythm: Normal rate and regular rhythm.     Pulses: Normal pulses.     Heart sounds: Normal heart sounds.  Pulmonary:     Effort: Pulmonary effort is normal.  Breath sounds: Normal breath sounds.  Musculoskeletal:     Cervical back: No tenderness.  Lymphadenopathy:     Cervical: No cervical adenopathy.  Skin:    General: Skin is warm and dry.     Capillary Refill: Capillary refill takes less than 2 seconds.  Neurological:     Mental Status: She is alert and oriented to person, place, and time.  Psychiatric:        Mood and Affect: Mood normal.        Behavior: Behavior normal.      ASSESSMENT & PLAN: A total of 33 minutes was spent with the patient and counseling/coordination of care regarding preparing for this visit, review of most recent office visit notes, review of chronic medical conditions under management, review of all medications, diagnosis of upper respiratory infection and possible sinus infection, need for antibiotics, symptom management, prognosis, documentation, and need for follow-up if no better or worse during the next several days  Problem List Items Addressed This Visit       Respiratory   Sinus congestion   Recommend frequent daily nasal saline spray irrigation Do not use over-the-counter decongestants Advised to rest and stay well-hydrated      Acute non-recurrent maxillary sinusitis - Primary   Upper viral respiratory infection now with secondary bacterial sinus infection Has used azithromycin  before with good results. May benefit from daily azithromycin  for 5 days Advised to rest and stay well-hydrated Advised to contact the office if no better or worse  during the next several days      Relevant Medications   azithromycin  (ZITHROMAX ) 250 MG tablet     Other   Flu-like symptoms   Symptom management discussed Advised to take over-the-counter Mucinex DM and cough drops Advised to rest and stay well-hydrated      Patient Instructions  Sinus Infection, Adult A sinus infection is soreness and swelling (inflammation) of your sinuses. Sinuses are hollow spaces in the bones around your face. They are located: Around your eyes. In the middle of your forehead. Behind your nose. In your cheekbones. Your sinuses and nasal passages are lined with a fluid called mucus. Mucus drains out of your sinuses. Swelling can trap mucus in your sinuses. This lets germs (bacteria, virus, or fungus) grow, which leads to infection. Most of the time, this condition is caused by a virus. What are the causes? Allergies. Asthma. Germs. Things that block your nose or sinuses. Growths in the nose (nasal polyps). Chemicals or irritants in the air. A fungus. This is rare. What increases the risk? Having a weak body defense system (immune system). Doing a lot of swimming or diving. Using nasal sprays too much. Smoking. What are the signs or symptoms? The main symptoms of this condition are pain and a feeling of pressure around the sinuses. Other symptoms include: Stuffy nose (congestion). This may make it hard to breathe through your nose. Runny nose (drainage). Soreness, swelling, and warmth in the sinuses. A cough that may get worse at night. Being unable to smell and taste. Mucus that collects in the throat or the back of the nose (postnasal drip). This may cause a sore throat or bad breath. Being very tired (fatigued). A fever. How is this diagnosed? Your symptoms. Your medical history. A physical exam. Tests to find out if your condition is short-term (acute) or long-term (chronic). Your doctor may: Check your nose for growths (polyps). Check your  sinuses using a tool that has a light on one end (  endoscope). Check for allergies or germs. Do imaging tests, such as an MRI or CT scan. How is this treated? Treatment for this condition depends on the cause and whether it is short-term or long-term. If caused by a virus, your symptoms should go away on their own within 10 days. You may be given medicines to relieve symptoms. They include: Medicines that shrink swollen tissue in the nose. A spray that treats swelling of the nostrils. Rinses that help get rid of thick mucus in your nose (nasal saline washes). Medicines that treat allergies (antihistamines). Over-the-counter pain relievers. If caused by bacteria, your doctor may wait to see if you will get better without treatment. You may be given antibiotic medicine if you have: A very bad infection. A weak body defense system. If caused by growths in the nose, surgery may be needed. Follow these instructions at home: Medicines Take, use, or apply over-the-counter and prescription medicines only as told by your doctor. These may include nasal sprays. If you were prescribed an antibiotic medicine, take it as told by your doctor. Do not stop taking it even if you start to feel better. Hydrate and humidify  Drink enough water to keep your pee (urine) pale yellow. Use a cool mist humidifier to keep the humidity level in your home above 50%. Breathe in steam for 10-15 minutes, 3-4 times a day, or as told by your doctor. You can do this in the bathroom while a hot shower is running. Try not to spend time in cool or dry air. Rest Rest as much as you can. Sleep with your head raised (elevated). Make sure you get enough sleep each night. General instructions  Put a warm, moist washcloth on your face 3-4 times a day, or as often as told by your doctor. Use nasal saline washes as often as told by your doctor. Wash your hands often with soap and water. If you cannot use soap and water, use hand  sanitizer. Do not smoke. Avoid being around people who are smoking (secondhand smoke). Keep all follow-up visits. Contact a doctor if: You have a fever. Your symptoms get worse. Your symptoms do not get better within 10 days. Get help right away if: You have a very bad headache. You cannot stop vomiting. You have very bad pain or swelling around your face or eyes. You have trouble seeing. You feel confused. Your neck is stiff. You have trouble breathing. These symptoms may be an emergency. Get help right away. Call 911. Do not wait to see if the symptoms will go away. Do not drive yourself to the hospital. Summary A sinus infection is swelling of your sinuses. Sinuses are hollow spaces in the bones around your face. This condition is caused by tissues in your nose that become inflamed or swollen. This traps germs. These can lead to infection. If you were prescribed an antibiotic medicine, take it as told by your doctor. Do not stop taking it even if you start to feel better. Keep all follow-up visits. This information is not intended to replace advice given to you by your health care provider. Make sure you discuss any questions you have with your health care provider. Document Revised: 08/14/2021 Document Reviewed: 08/14/2021 Elsevier Patient Education  2024 [employer]vier [employer].    Emil Schaumann, MD [facility]

## 2023-10-01 NOTE — Assessment & Plan Note (Signed)
 Recommend frequent daily nasal saline spray irrigation Do not use over-the-counter decongestants Advised to rest and stay well-hydrated

## 2023-10-01 NOTE — Assessment & Plan Note (Signed)
 Symptom management discussed Advised to take over-the-counter Mucinex DM and cough drops Advised to rest and stay well-hydrated

## 2023-10-01 NOTE — Assessment & Plan Note (Signed)
 Upper viral respiratory infection now with secondary bacterial sinus infection Has used azithromycin  before with good results. May benefit from daily azithromycin  for 5 days Advised to rest and stay well-hydrated Advised to contact the office if no better or worse during the next several days

## 2024-02-02 ENCOUNTER — Other Ambulatory Visit: Payer: Self-pay

## 2024-02-02 ENCOUNTER — Other Ambulatory Visit: Payer: Self-pay | Admitting: Internal Medicine

## 2024-03-31 ENCOUNTER — Ambulatory Visit (INDEPENDENT_AMBULATORY_CARE_PROVIDER_SITE_OTHER): Admitting: Internal Medicine

## 2024-03-31 ENCOUNTER — Encounter: Payer: Self-pay | Admitting: Internal Medicine

## 2024-03-31 VITALS — BP 122/70 | HR 70 | Temp 98.6°F | Ht 69.0 in | Wt 239.6 lb

## 2024-03-31 DIAGNOSIS — N951 Menopausal and female climacteric states: Secondary | ICD-10-CM

## 2024-03-31 DIAGNOSIS — E538 Deficiency of other specified B group vitamins: Secondary | ICD-10-CM

## 2024-03-31 DIAGNOSIS — Z0001 Encounter for general adult medical examination with abnormal findings: Secondary | ICD-10-CM

## 2024-03-31 DIAGNOSIS — E039 Hypothyroidism, unspecified: Secondary | ICD-10-CM | POA: Diagnosis not present

## 2024-03-31 DIAGNOSIS — Z0000 Encounter for general adult medical examination without abnormal findings: Secondary | ICD-10-CM

## 2024-03-31 DIAGNOSIS — E559 Vitamin D deficiency, unspecified: Secondary | ICD-10-CM | POA: Diagnosis not present

## 2024-03-31 DIAGNOSIS — I10 Essential (primary) hypertension: Secondary | ICD-10-CM | POA: Diagnosis not present

## 2024-03-31 DIAGNOSIS — R739 Hyperglycemia, unspecified: Secondary | ICD-10-CM | POA: Diagnosis not present

## 2024-03-31 LAB — BASIC METABOLIC PANEL WITH GFR
BUN: 12 mg/dL (ref 6–23)
CO2: 28 meq/L (ref 19–32)
Calcium: 9.8 mg/dL (ref 8.4–10.5)
Chloride: 104 meq/L (ref 96–112)
Creatinine, Ser: 0.86 mg/dL (ref 0.40–1.20)
GFR: 76.39 mL/min (ref >60.00)
Glucose, Bld: 77 mg/dL (ref 70–99)
Potassium: 3.8 meq/L (ref 3.5–5.1)
Sodium: 138 meq/L (ref 135–145)

## 2024-03-31 LAB — CBC WITH DIFFERENTIAL/PLATELET
Basophils Absolute: 0 K/uL (ref 0.0–0.1)
Basophils Relative: 0.7 % (ref 0.0–3.0)
Eosinophils Absolute: 0.1 K/uL (ref 0.0–0.7)
Eosinophils Relative: 2 % (ref 0.0–5.0)
HCT: 41 % (ref 36.0–46.0)
Hemoglobin: 13.8 g/dL (ref 12.0–15.0)
Lymphocytes Relative: 43.4 % (ref 12.0–46.0)
Lymphs Abs: 2 K/uL (ref 0.7–4.0)
MCHC: 33.5 g/dL (ref 30.0–36.0)
MCV: 91.2 fl (ref 78.0–100.0)
Monocytes Absolute: 0.5 K/uL (ref 0.1–1.0)
Monocytes Relative: 10.7 % (ref 3.0–12.0)
Neutro Abs: 2 K/uL (ref 1.4–7.7)
Neutrophils Relative %: 43.2 % (ref 43.0–77.0)
Platelets: 221 K/uL (ref 150.0–400.0)
RBC: 4.5 Mil/uL (ref 3.87–5.11)
RDW: 13.6 % (ref 11.5–15.5)
WBC: 4.6 K/uL (ref 4.0–10.5)

## 2024-03-31 LAB — HEPATIC FUNCTION PANEL
ALT: 20 U/L (ref 0–35)
AST: 18 U/L (ref 0–37)
Albumin: 4.3 g/dL (ref 3.5–5.2)
Alkaline Phosphatase: 46 U/L (ref 39–117)
Bilirubin, Direct: 0.1 mg/dL (ref 0.0–0.3)
Total Bilirubin: 0.4 mg/dL (ref 0.2–1.2)
Total Protein: 7.6 g/dL (ref 6.0–8.3)

## 2024-03-31 LAB — THYROID PANEL WITH TSH
Free Thyroxine Index: 1.4 (ref 1.4–3.8)
T3 Uptake: 31 % (ref 22–35)
T4, Total: 4.4 ug/dL — ABNORMAL LOW (ref 5.1–11.9)
TSH: 1.51 m[IU]/L

## 2024-03-31 LAB — CORTISOL: Cortisol, Plasma: 5.9 ug/dL

## 2024-03-31 LAB — ESTRADIOL: Estradiol: 36 pg/mL

## 2024-03-31 LAB — VITAMIN B12: Vitamin B-12: 417 pg/mL (ref 211–911)

## 2024-03-31 LAB — VITAMIN D 25 HYDROXY (VIT D DEFICIENCY, FRACTURES): VITD: 23.57 ng/mL — ABNORMAL LOW (ref 30.00–100.00)

## 2024-03-31 LAB — LIPID PANEL
Cholesterol: 159 mg/dL (ref 0–200)
HDL: 35 mg/dL — ABNORMAL LOW (ref >39.00)
LDL Cholesterol: 99 mg/dL (ref 0–99)
NonHDL: 124.46
Total CHOL/HDL Ratio: 5
Triglycerides: 126 mg/dL (ref 0.0–149.0)
VLDL: 25.2 mg/dL (ref 0.0–40.0)

## 2024-03-31 LAB — HEMOGLOBIN A1C: Hgb A1c MFr Bld: 6.4 % (ref 4.6–6.5)

## 2024-03-31 LAB — FOLLICLE STIMULATING HORMONE: FSH: 13.6 m[IU]/mL

## 2024-03-31 LAB — TESTOSTERONE: Testosterone: 63.3 ng/dL — ABNORMAL HIGH (ref 15.00–40.00)

## 2024-03-31 MED ORDER — LEVOTHYROXINE SODIUM 75 MCG PO TABS: 75.0000 ug | ORAL_TABLET | Freq: Every day | ORAL | 3 refills | Status: AC

## 2024-03-31 MED ORDER — HYDROCHLOROTHIAZIDE 12.5 MG PO CAPS: 12.5000 mg | ORAL_CAPSULE | Freq: Every day | ORAL | 3 refills | Status: AC

## 2024-03-31 NOTE — Assessment & Plan Note (Signed)
 Lab Results  Component Value Date   HGBA1C 6.5 02/13/2023   Stable, pt to continue current medical treatment  - diet, wt control, but consider mounjaro pending any increased A1c today

## 2024-03-31 NOTE — Assessment & Plan Note (Signed)
 Also for [facility] with hormone levels,  to f/u any worsening symptoms or concerns

## 2024-03-31 NOTE — Patient Instructions (Signed)
 Please continue all other medications as before, and refills have been done if requested.  We could consider starting Mounjaro 2.5 mg weekly if the A1c is high  Please have the pharmacy call with any other refills you may need.  Please continue your efforts at being more active, low cholesterol diet, and weight control.  You are otherwise up to date with prevention measures today.  Please keep your appointments with your specialists as you may have planned  Please go to the LAB at the blood drawing area for the tests to be done  You will be contacted by phone if any changes need to be made immediately.  Otherwise, you will receive a letter about your results with an explanation, but please check with MyChart first.  Please make an Appointment to return for your 1 year visit, or sooner if needed

## 2024-03-31 NOTE — Assessment & Plan Note (Signed)
 BP Readings from Last 3 Encounters:  03/31/24 122/70  10/01/23 (!) 152/92  02/13/23 (!) 180/140   Stable, pt to continue medical treatment hct 12.5 every day,

## 2024-03-31 NOTE — Progress Notes (Signed)
 Patient ID: Lauren Perkins, female   DOB: 02-19-1971, 53 y.o.   MRN: [id number]         Chief Complaint:: wellness exam and low vit d, low thyroid , hyperglycemia, htn       HPI:  Lauren Perkins is a 53 y.o. female here for wellness exam; for shingrix and prevnar at pharmacy, declines hep B, o/w up to date                        Also Pt denies chest pain, increased sob or doe, wheezing, orthopnea, PND, increased LE swelling, palpitations, dizziness or syncope.   Pt denies polydipsia, polyuria, or new focal neuro s/s.    Pt denies fever, night sweats, loss of appetite, or other constitutional symptoms  Lost wt with better die and exercise - walks 2 miles 4 times per wk,  Did have recent onset amenorrhea and asking for hormone eval to confirm menopause   Wt Readings from Last 3 Encounters:  03/31/24 239 lb 9.6 oz (108.7 kg)  10/01/23 236 lb 12.8 oz (107.4 kg)  02/13/23 245 lb (111.1 kg)   BP Readings from Last 3 Encounters:  03/31/24 122/70  10/01/23 (!) 152/92  02/13/23 (!) 180/140   Immunization History  Administered Date(s) Administered   PFIZER(Purple Top)SARS-COV-2 Vaccination 04/12/2020, 05/05/2020   Tdap 06/14/2015   Health Maintenance Due  Topic Date Due   Pneumococcal Vaccine 27-62 Years old (1 of 2 - PCV) Never done   Hepatitis B Vaccines (1 of 3 - 19+ 3-dose series) Never done   Zoster Vaccines- Shingrix (1 of 2) Never done      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    Fibroids    Hypothyroidism    Past Surgical History:  Procedure Laterality Date   BREAST LUMPECTOMY Left [id number]   atipical hyperplasia   EYE SURGERY     right eye when she was 3    reports that she has never smoked. She has never used smokeless tobacco. She reports that she does not drink alcohol and does not use drugs. family history includes Alcohol abuse in her father; Allergic rhinitis in her  mother; Arthritis in her mother; Asthma in her mother and sister; Breast cancer in her paternal aunt; Cancer in her paternal aunt; Diabetes in her mother; Heart disease in her mother; Hypertension in her mother; Mental illness in her father. No Known Allergies Current Outpatient Medications on File Prior to Visit  Medication Sig Dispense Refill   Cholecalciferol (VITAMIN D ) 50 MCG (2000 UT) tablet Take 1 tablet (2,000 Units total) by mouth daily. 30 tablet 99   clobetasol  (TEMOVATE ) 0.05 % external solution Apply 1-7 times per week 50 mL 3   No current facility-administered medications on file prior to visit.        ROS:  All others reviewed and negative.  Objective        PE:  BP 122/70   Pulse 70   Temp 98.6 F (37 C)   Ht 5' 9 (1.753 m)   Wt 239 lb 9.6 oz (108.7 kg)   LMP 06/09/2019   SpO2 98%   BMI 35.38 kg/m                 Constitutional: Pt appears in NAD  HENT: Head: NCAT.                Right Ear: External ear normal.                 Left Ear: External ear normal.                Eyes: . Pupils are equal, round, and reactive to light. Conjunctivae and EOM are normal               Nose: without d/c or deformity               Neck: Neck supple. Gross normal ROM               Cardiovascular: Normal rate and regular rhythm.                 Pulmonary/Chest: Effort normal and breath sounds without rales or wheezing.                Abd:  Soft, NT, ND, + BS, no organomegaly               Neurological: Pt is alert. At baseline orientation, motor grossly intact               Skin: Skin is warm. No rashes, no other new lesions, LE edema - none               Psychiatric: Pt behavior is normal without agitation   Micro: none  Cardiac tracings I have personally interpreted today:  none  Pertinent Radiological findings (summarize): none   Lab Results  Component Value Date   WBC 5.1 02/13/2023   HGB 13.4 02/13/2023   HCT 41.0 02/13/2023   PLT 220.0 02/13/2023    GLUCOSE 84 02/13/2023   CHOL 158 02/13/2023   TRIG 122.0 02/13/2023   HDL 34.00 (L) 02/13/2023   LDLCALC 99 02/13/2023   ALT 25 02/13/2023   AST 20 02/13/2023   NA 140 02/13/2023   K 3.9 02/13/2023   CL 104 02/13/2023   CREATININE 0.98 02/13/2023   BUN 9 02/13/2023   CO2 29 02/13/2023   TSH 1.41 02/13/2023   HGBA1C 6.5 02/13/2023   Assessment/Plan:  Lauren Perkins is a 53 y.o. Black or African American [2] female with  has a past medical history of Allergy, Cancer (HCC), Fibroids, and Hypothyroidism.  Encounter for well adult exam with abnormal findings Age and sex appropriate education and counseling updated with regular exercise and diet Referrals for preventative services - none needed Immunizations addressed - for shignrix and prevnar at pharmacy Smoking counseling  - none needed Evidence for depression or other mood disorder - none significant Most recent labs reviewed. I have personally reviewed and have noted: 1) the patient's medical and social history 2) The patient's current medications and supplements 3) The patient's height, weight, and BMI have been recorded in the chart   Vitamin D  deficiency Last vitamin D  Lab Results  Component Value Date   VD25OH 29.79 (L) 02/13/2023   Low, to start oral replacement   Menopausal symptoms Also for Lauren Perkins with hormone levels,  to f/u any worsening symptoms or concerns  Hypothyroidism Lab Results  Component Value Date   TSH 1.41 02/13/2023   Stable, pt to continue levothyroxine  75 mcg every day, for thyroid  panel today   Hyperglycemia Lab Results  Component Value Date   HGBA1C 6.5 02/13/2023   Stable, pt to continue current medical treatment  -  diet, wt control, but consider mounjaro pending any increased A1c today   Essential hypertension BP Readings from Last 3 Encounters:  03/31/24 122/70  10/01/23 (!) 152/92  02/13/23 (!) 180/140   Stable, pt to continue medical treatment hct 12.5 every day,     Followup: Return in about 1 year (around 03/31/2025).  Lauren Rush, MD 03/31/2024 8:06 PM [facility]

## 2024-03-31 NOTE — Assessment & Plan Note (Signed)
 Age and sex appropriate education and counseling updated with regular exercise and diet Referrals for preventative services - none needed Immunizations addressed - for shignrix and prevnar at pharmacy Smoking counseling  - none needed Evidence for depression or other mood disorder - none significant Most recent labs reviewed. I have personally reviewed and have noted: 1) the patient's medical and social history 2) The patient's current medications and supplements 3) The patient's height, weight, and BMI have been recorded in the chart

## 2024-03-31 NOTE — Assessment & Plan Note (Signed)
Last vitamin D Lab Results  Component Value Date   VD25OH 29.79 (L) 02/13/2023   Low, to start oral replacement

## 2024-03-31 NOTE — Assessment & Plan Note (Signed)
 Lab Results  Component Value Date   TSH 1.41 02/13/2023   Stable, pt to continue levothyroxine  75 mcg every day, for thyroid  panel today

## 2024-04-01 ENCOUNTER — Ambulatory Visit: Payer: Self-pay | Admitting: Internal Medicine

## 2024-04-01 DIAGNOSIS — R7989 Other specified abnormal findings of blood chemistry: Secondary | ICD-10-CM

## 2024-04-01 LAB — URINALYSIS, ROUTINE W REFLEX MICROSCOPIC
Bilirubin Urine: NEGATIVE
Ketones, ur: NEGATIVE
Leukocytes,Ua: NEGATIVE
Nitrite: NEGATIVE
Specific Gravity, Urine: 1.025 (ref 1.000–1.030)
Total Protein, Urine: NEGATIVE
Urine Glucose: NEGATIVE
Urobilinogen, UA: 0.2 (ref 0.0–1.0)
pH: 6 (ref 5.0–8.0)

## 2024-04-05 NOTE — Telephone Encounter (Signed)
 Ok this is done

## 2024-04-26 ENCOUNTER — Telehealth: Payer: Self-pay

## 2024-04-26 DIAGNOSIS — L918 Other hypertrophic disorders of the skin: Secondary | ICD-10-CM

## 2024-04-26 NOTE — Telephone Encounter (Signed)
 Referral placed.

## 2024-04-30 NOTE — Telephone Encounter (Signed)
 Ok I will do new referral

## 2024-05-18 ENCOUNTER — Encounter: Payer: Self-pay | Admitting: Dermatology

## 2024-05-18 ENCOUNTER — Ambulatory Visit: Admitting: Dermatology

## 2024-05-18 DIAGNOSIS — L219 Seborrheic dermatitis, unspecified: Secondary | ICD-10-CM | POA: Diagnosis not present

## 2024-05-18 DIAGNOSIS — L918 Other hypertrophic disorders of the skin: Secondary | ICD-10-CM

## 2024-05-18 MED ORDER — CLOBETASOL PROPIONATE 0.05 % EX SOLN: CUTANEOUS | 3 refills | Status: AC

## 2024-05-18 MED ORDER — KETOCONAZOLE 2 % EX SHAM
MEDICATED_SHAMPOO | CUTANEOUS | 11 refills | Status: AC
Start: 2024-05-18 — End: ?

## 2024-05-18 NOTE — Progress Notes (Signed)
 New Patient Visit   Subjective  Lauren Perkins is a 53 y.o. female who presents for the following: Pt c/o skin tags neck around the neck that get irritated from rubbing on clothing and jewelry, she would like removed today. Dandruff of the scalp - for many years, pt was prescribed clobetasol  solution in the past, and that did help with itching, but she continued to flake.  The following portions of the chart were reviewed this encounter and updated as appropriate: medications, allergies, medical history  Review of Systems:  No other skin or systemic complaints except as noted in HPI or Assessment and Plan.  Objective  Well appearing patient in no apparent distress; mood and affect are within normal limits.   A focused examination was performed of the following areas: the face, chest, neck, and arms   Relevant exam findings are noted in the Assessment and Plan.    Assessment & Plan   ACROCHORDONS (Skin Tags) - Removal desired by patient - Fleshy, skin-colored pedunculated papules - Benign appearing.  - Patient desires removal. Reviewed that this is not covered by insurance and they will be charged a cosmetic fee for removal. Patient signed non-covered consent.  Procedure Note: - Prior to the procedure, reviewed the expected small wound. Also reviewed the risk of leaving a small scar and the small risk of infection.  PROCEDURE - The areas were prepped with isopropyl alcohol. A small amount of lidocaine 1% with epinephrine was injected at the base of each lesion to achieve good local anesthesia. The skin tags were removed using a snip technique. Aluminum chloride was used for hemostasis.The procedure was tolerated well. - Wound care was reviewed with the patient. They were advised to call with any concerns.  - Locations: L neck x 2, L AC fossa x 1, R neck x 2, L uooer chest x 2, central upper chest x 2, R upper chest x 5 - Total number of treated acrochordons 14    SEBORRHEIC  DERMATITIS Exam: diffuse scaling of the scalp  Chronic and persistent condition with duration or expected duration over one year. Condition is symptomatic/ bothersome to patient. Not currently at goal.  Seborrheic Dermatitis is a chronic persistent rash characterized by pinkness and scaling most commonly of the mid face but also can occur on the scalp (dandruff), ears; mid chest, mid back and groin.  It tends to be exacerbated by stress and cooler weather.  People who have neurologic disease may experience new onset or exacerbation of existing seborrheic dermatitis.  The condition is not curable but treatable and can be controlled.  Treatment Plan: Start Ketoconazole  2% shampoo. Massage into scalp let sit 3-5 minutes then wash out. Use twice per week or when you wash your hair.   Restart clobetasol  solution to itchy areas of the scalp twice daily until no longer itching. Topical steroids (such as triamcinolone, fluocinolone, fluocinonide, mometasone, clobetasol , halobetasol, betamethasone, hydrocortisone) can cause thinning and lightening of the skin if they are used for too long in the same area. Your physician has selected the right strength medicine for your problem and area affected on the body. Please use your medication only as directed by your physician to prevent side effects.  SEBORRHEIC DERMATITIS   ACROCHORDON    Return if symptoms worsen or fail to improve.  LILLETTE Rosina Mayans, CMA, am acting as scribe for Boneta Sharps, MD .   Documentation: I have reviewed the above documentation for accuracy and completeness, and I agree with  the above.  Boneta Sharps, MD

## 2024-05-18 NOTE — Patient Instructions (Addendum)
 SEBORRHEIC DERMATITIS Exam: Pink patches with greasy scale at the scalp  Chronic and persistent condition with duration or expected duration over one year. Condition is symptomatic/ bothersome to patient. Not currently at goal.  Seborrheic Dermatitis is a chronic persistent rash characterized by pinkness and scaling most commonly of the mid face but also can occur on the scalp (dandruff), ears; mid chest, mid back and groin.  It tends to be exacerbated by stress and cooler weather.  People who have neurologic disease may experience new onset or exacerbation of existing seborrheic dermatitis.  The condition is not curable but treatable and can be controlled.  Treatment Plan: Start Ketoconazole  2% shampoo. Massage into scalp let sit 3-5 minutes then wash out. Use twice per week or when you wash your hair.   Restart clobetasol  solution to itchy areas of the scalp twice daily until no longer itching. Topical steroids (such as triamcinolone, fluocinolone, fluocinonide, mometasone, clobetasol , halobetasol, betamethasone, hydrocortisone) can cause thinning and lightening of the skin if they are used for too long in the same area. Your physician has selected the right strength medicine for your problem and area affected on the body. Please use your medication only as directed by your physician to prevent side effects.   Due to recent changes in healthcare laws, you may see results of your pathology and/or laboratory studies on MyChart before the doctors have had a chance to review them. We understand that in some cases there may be results that are confusing or concerning to you. Please understand that not all results are received at the same time and often the doctors may need to interpret multiple results in order to provide you with the best plan of care or course of treatment. Therefore, we ask that you please give us  2 business days to thoroughly review all your results before contacting the office for  clarification. Should we see a critical lab result, you will be contacted sooner.   If You Need Anything After Your Visit  If you have any questions or concerns for your doctor, please call our main line at [phone] and press option 4 to reach your doctor's medical assistant. If no one answers, please leave a voicemail as directed and we will return your call as soon as possible. Messages left after 4 pm will be answered the following business day.   You may also send us  a message via MyChart. We typically respond to MyChart messages within 1-2 business days.  For prescription refills, please ask your pharmacy to contact our office. Our fax number is [phone].  If you have an urgent issue when the clinic is closed that cannot wait until the next business day, you can page your doctor at the number below.    Please note that while we do our best to be available for urgent issues outside of office hours, we are not available 24/7.   If you have an urgent issue and are unable to reach us , you may choose to seek medical care at your doctor's office, retail clinic, urgent care center, or emergency room.  If you have a medical emergency, please immediately call 911 or go to the emergency department.  Pager Numbers  - Dr. Hester: [phone]  - Dr. Jackquline: [phone]  - Dr. Claudene: [phone]   - Dr. Raymund: [phone]  In the event of inclement weather, please call our main line at [phone] for an update on the status of any delays or closures.  Dermatology Medication Tips: Please  keep the boxes that topical medications come in in order to help keep track of the instructions about where and how to use these. Pharmacies typically print the medication instructions only on the boxes and not directly on the medication tubes.   If your medication is too expensive, please contact our office at [phone] option 4 or send us  a message through MyChart.   We are unable to  tell what your co-pay for medications will be in advance as this is different depending on your insurance coverage. However, we may be able to find a substitute medication at lower cost or fill out paperwork to get insurance to cover a needed medication.   If a prior authorization is required to get your medication covered by your insurance company, please allow us  1-2 business days to complete this process.  Drug prices often vary depending on where the prescription is filled and some pharmacies may offer cheaper prices.  The website www.goodrx.com contains coupons for medications through different pharmacies. The prices here do not account for what the cost may be with help from insurance (it may be cheaper with your insurance), but the website can give you the price if you did not use any insurance.  - You can print the associated coupon and take it with your prescription to the pharmacy.  - You may also stop by our office during regular business hours and pick up a GoodRx coupon card.  - If you need your prescription sent electronically to a different pharmacy, notify our office through [facility] or by phone at [phone] option 4.     Si Usted Necesita Algo Despus de Su Visita  Tambin puede enviarnos un mensaje a travs de Clinical cytogeneticist. Por lo general respondemos a los mensajes de MyChart en el transcurso de 1 a 2 das hbiles.  Para renovar recetas, por favor pida a su farmacia que se ponga en contacto con nuestra oficina. Randi lakes de fax es [place] [phone].  Si tiene un asunto urgente cuando la clnica est cerrada y que no puede esperar hasta el siguiente da hbil, puede llamar/localizar a su doctor(a) al nmero que aparece a continuacin.   Por favor, tenga en cuenta que aunque hacemos todo lo posible para estar disponibles para asuntos urgentes fuera del horario de [place], no estamos disponibles las 24 horas del da, los 7 [address]  [address] de la [place].   Si tiene un problema  urgente y no puede comunicarse con nosotros, puede optar por buscar atencin mdica  en el consultorio de su doctor(a), en una clnica privada, en un centro de atencin urgente o en una sala de emergencias.  Si tiene Engineer, drilling, por favor llame inmediatamente al 911 o vaya a la sala de emergencias.  Nmeros de bper  - Dr. Hester: [phone]  - Dra. Jackquline: [phone]  - Dr. Claudene: [phone]  - Dra. Kitts: [phone]  En caso de inclemencias del [place], por favor llame a nuestra lnea principal al [phone] para una actualizacin sobre el estado de cualquier retraso o cierre.  Consejos para la medicacin en dermatologa: Por favor, guarde las cajas en las que vienen los medicamentos de uso tpico para ayudarle a seguir las instrucciones sobre dnde y cmo usarlos. Las farmacias generalmente imprimen las instrucciones del medicamento slo en las cajas y no directamente en los tubos del [place].   Si su medicamento es muy caro, por favor, pngase en contacto con landry rieger llamando al [phone] y presione la opcin 4 o envenos  un mensaje a travs de MyChart.   No podemos decirle cul ser su copago por los medicamentos por adelantado ya que esto es diferente dependiendo de la cobertura de su seguro. Sin embargo, es posible que podamos encontrar un medicamento sustituto a Audiological scientist un formulario para que el seguro cubra el medicamento que se considera necesario.   Si se requiere una autorizacin previa para que su compaa de seguros malta su medicamento, por favor permtanos de 1 a 2 das hbiles para completar este proceso.  Los precios de los medicamentos varan con frecuencia dependiendo del Environmental consultant de dnde se surte la receta y alguna farmacias pueden ofrecer precios ms baratos.  El sitio web www.goodrx.com tiene cupones para medicamentos de Health and safety inspector. Los precios aqu no tienen en cuenta lo que podra costar con la ayuda del  seguro (puede ser ms barato con su seguro), pero el sitio web puede darle el precio si no utiliz Tourist information centre manager.  - Puede imprimir el cupn correspondiente y llevarlo con su receta a la farmacia.  - Tambin puede pasar por nuestra oficina durante el horario de atencin regular y Education officer, museum una tarjeta de cupones de GoodRx.  - Si necesita que su receta se enve electrnicamente a una farmacia diferente, informe a nuestra oficina a travs de MyChart de [place] o por telfono llamando al [phone] y presione la opcin 4.

## 2024-06-22 ENCOUNTER — Encounter: Payer: Self-pay | Admitting: Internal Medicine

## 2024-10-13 ENCOUNTER — Ambulatory Visit: Admitting: "Endocrinology

## 2024-10-13 ENCOUNTER — Other Ambulatory Visit

## 2024-10-13 ENCOUNTER — Encounter: Payer: Self-pay | Admitting: "Endocrinology

## 2024-10-13 VITALS — BP 122/80 | HR 74 | Ht 69.0 in | Wt 234.0 lb

## 2024-10-13 DIAGNOSIS — E039 Hypothyroidism, unspecified: Secondary | ICD-10-CM

## 2024-10-13 MED ORDER — LIOTHYRONINE SODIUM 5 MCG PO TABS: 5.0000 ug | ORAL_TABLET | Freq: Every day | ORAL | 1 refills | Status: AC

## 2024-10-13 NOTE — Addendum Note (Signed)
 Addended by: Destynee Stringfellow on: 10/13/2024 01:45 PM   Modules accepted: Orders

## 2024-10-13 NOTE — Progress Notes (Signed)
 "    Outpatient Endocrinology Note [name], MD    Lauren Perkins [birth year] [id number]  Referring Provider: Norleen Lynwood ORN, MD Primary Care Provider: Norleen Lynwood ORN, MD Reason for consultation: Subjective   Assessment & Plan  Diagnoses and all orders for this visit:  Acquired hypothyroidism -     liothyronine  (CYTOMEL ) 5 MCG tablet; Take 1 tablet (5 mcg total) by mouth daily. -     TSH + free T4   Assessment and Plan Assessment & Plan Hypothyroidism TSH normal, thyroid  function well-managed. Total T4 slightly abnormal, not clinically significant. Discussed adding T3 hormone to address fatigue. Sleep apnea may contribute to fatigue. - Ordered TSH and free T4 tests. - Prescribed Cytomel  with levothyroxine . - Encouraged follow-up with sleep apnea specialist. - Scheduled follow-up in three months to assess Cytomel  response and adjust dosage.    Return in about 3 months (around 01/11/2025) for visit + labs before next visit, labs today.   I have reviewed current medications, nurse's notes, allergies, vital signs, past medical and surgical history, family medical history, and social history for this encounter. Counseled patient on symptoms, examination findings, lab findings, imaging results, treatment decisions and monitoring and prognosis. The patient understood the recommendations and agrees with the treatment plan. All questions regarding treatment plan were fully answered.  [name], MD  10/13/24   History of Present Illness HPI  Discussed the use of AI scribe software for clinical note transcription with the patient, who gave verbal consent to proceed.  History of Present Illness Lauren Perkins is a 54 year old female with hypothyroidism who presents with concerns about abnormal thyroid  and hormone levels.  She was diagnosed with hypothyroidism over ten years ago and takes levothyroxine  0.75 mg daily. She reports an abnormal recent thyroid  panel and  has fatigue, brain fog, cold intolerance, and difficulty losing weight. Her weight is 230 lb, down from a prior 240-250 lb. She has no dysphagia, dyspnea, or voice change.  A recent hormone panel showed elevated testosterone  despite being post-menopausal. She has no hirsutism or other virilizing symptoms.  She has mild sleep apnea diagnosed on sleep study and is planning treatment with a mouthpiece now that she has braces.  She is not taking other medications or supplements that could interfere with levothyroxine , including hormone pills, heartburn medications, calcium, iron, or multivitamins within four hours of her thyroid  pill.    Physical Exam  BP 122/80   Pulse 74   Ht 5' 9 (1.753 m)   Wt 234 lb (106.1 kg)   LMP 06/09/2019   SpO2 98%   BMI 34.56 kg/m    Constitutional: well developed, well nourished Head: normocephalic, atraumatic Eyes: sclera anicteric, no redness Neck: supple Lungs: normal respiratory effort Neurology: alert and oriented Skin: dry, no appreciable rashes Musculoskeletal: no appreciable defects Psychiatric: normal mood and affect   Current Medications Patient's Medications  New Prescriptions   LIOTHYRONINE  (CYTOMEL ) 5 MCG TABLET    Take 1 tablet (5 mcg total) by mouth daily.  Previous Medications   CHOLECALCIFEROL (VITAMIN D ) 50 MCG (2000 UT) TABLET    Take 1 tablet (2,000 Units total) by mouth daily.   CLOBETASOL  (TEMOVATE ) 0.05 % EXTERNAL SOLUTION    Apply to itchy areas of the scalp twice daily as needed. Avoid applying to face, groin, and axilla. Use as directed. Long-term use can cause thinning of the skin.   HYDROCHLOROTHIAZIDE  (MICROZIDE ) 12.5 MG CAPSULE    Take 1 capsule (12.5 mg total) by  mouth daily.   KETOCONAZOLE  (NIZORAL ) 2 % SHAMPOO    Massage into the scalp let sit 3-5 minutes then wash out. Use twice per week.   LEVOTHYROXINE  (SYNTHROID ) 75 MCG TABLET    Take 1 tablet (75 mcg total) by mouth daily.  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  Social History Social History   Socioeconomic History   Marital status: Married    Spouse name: Not on file   Number of children: Not on file   Years of education: Not on file   Highest education level: Doctorate  Occupational History   Not on file  Tobacco Use   Smoking status: Never   Smokeless tobacco: Never  Vaping Use   Vaping status: Never Used  Substance and Sexual Activity   Alcohol use: No    Alcohol/week: 0.0 standard drinks of alcohol   Drug use: No   Sexual activity: Yes    Birth control/protection: None    Comment: NUVA-RING  Other Topics Concern   Not on file  Social History Narrative   Not on file   Social Drivers of Health   Tobacco Use: Low Risk (10/13/2024)   Patient History    Smoking Tobacco Use: Never    Smokeless Tobacco Use: Never    Passive Exposure: Not on file  Financial Resource Strain: Low Risk (03/30/2024)   Overall Financial Resource Strain (CARDIA)    Difficulty of Paying Living Expenses: Not hard at all  Food Insecurity: No Food Insecurity (03/30/2024)   Epic    Worried About Radiation Protection Practitioner of Food in the Last Year: Never true    Ran Out of Food in the Last  Year: Never true  Transportation Needs: No Transportation Needs (03/30/2024)   Epic    Lack of Transportation (Medical): No    Lack of Transportation (Non-Medical): No  Physical Activity: Sufficiently Active (03/30/2024)   Exercise Vital Sign    Days of Exercise per Week: 4 days    Minutes of Exercise per Session: 40 min  Stress: No Stress Concern Present (03/30/2024)   [employer] of Occupational Health - Occupational Stress Questionnaire    Feeling of Stress: Not at all  Social Connections: Socially Integrated (03/30/2024)   Social Connection and Isolation Panel    Frequency of Communication with Friends and Family: More than three times a week    Frequency of Social Gatherings with Friends and Family: Three times a week    Attends Religious Services: More than 4 times per year    Active Member of Clubs or Organizations: Yes    Attends Banker Meetings: 1 to  4 times per year    Marital Status: Married  Catering Manager Violence: Not on file  Depression [phone]): Low Risk (03/31/2024)   Depression (PHQ2-9)    PHQ-2 Score: 0  Alcohol Screen: Not on file  Housing: Unknown (03/30/2024)   Epic    Unable to Pay for Housing in the Last Year: No    Number of Times Moved in the Last Year: Not on file    Homeless in the Last Year: No  Utilities: Not on file  Health Literacy: Not on file    Lab Results  Component Value Date   CHOL 159 03/31/2024   Lab Results  Component Value Date   HDL 35.00 (L) 03/31/2024   Lab Results  Component Value Date   LDLCALC 99 03/31/2024   Lab Results  Component Value Date   TRIG 126.0 03/31/2024   Lab Results  Component Value Date   CHOLHDL 5 03/31/2024   Lab Results  Component Value Date   CREATININE 0.86 03/31/2024   Lab Results  Component Value Date   GFR 76.39 03/31/2024      Component Value Date/Time   NA 138 03/31/2024 1420   K 3.8 03/31/2024 1420   CL 104 03/31/2024 1420   CO2 28 03/31/2024 1420   GLUCOSE 77  03/31/2024 1420   BUN 12 03/31/2024 1420   CREATININE 0.86 03/31/2024 1420   CREATININE 0.81 09/07/2021 1622   CALCIUM 9.8 03/31/2024 1420   PROT 7.6 03/31/2024 1420   ALBUMIN 4.3 03/31/2024 1420   AST 18 03/31/2024 1420   ALT 20 03/31/2024 1420   ALKPHOS 46 03/31/2024 1420   BILITOT 0.4 03/31/2024 1420   GFRNONAA >60 11/05/2019 2046   GFRAA >60 11/05/2019 2046      Latest Ref Rng & Units 03/31/2024    2:20 PM 02/13/2023    1:54 PM 01/28/2022   10:03 AM  BMP  Glucose 70 - 99 mg/dL 77  84  94   BUN 6 - 23 mg/dL 12  9  10    Creatinine 0.40 - 1.20 mg/dL 9.13  9.01  9.09   Sodium 135 - 145 mEq/L 138  140  138   Potassium 3.5 - 5.1 mEq/L 3.8  3.9  4.0   Chloride 96 - 112 mEq/L 104  104  105   CO2 19 - 32 mEq/L 28  29  23    Calcium 8.4 - 10.5 mg/dL 9.8  9.6  9.4        Component Value Date/Time   WBC 4.6 03/31/2024 1420   RBC 4.50 03/31/2024 1420   HGB 13.8 03/31/2024 1420   HCT 41.0 03/31/2024 1420   PLT 221.0 03/31/2024 1420   MCV 91.2 03/31/2024 1420   MCH 31.4 09/07/2021 1622   MCHC 33.5 03/31/2024 1420   RDW 13.6 03/31/2024 1420   LYMPHSABS 2.0 03/31/2024 1420   MONOABS 0.5 03/31/2024 1420   EOSABS 0.1 03/31/2024 1420   BASOSABS 0.0 03/31/2024 1420   Lab Results  Component Value Date   TSH 1.51 03/31/2024   TSH 1.41 02/13/2023   TSH 2.92 01/28/2022   FREET4 0.62 02/13/2023   FREET4 0.52 (L) 01/28/2022   FREET4 0.8 09/07/2021         Parts of this note may have been dictated using voice recognition software. There may be variances in spelling and vocabulary which are unintentional. Not all errors are proofread. Please notify the dino if any discrepancies are noted or if the meaning of any statement  is not clear.      [1]  Allergies Allergen Reactions   Dust Mite Extract    "

## 2024-10-14 LAB — TSH+FREE T4: TSH W/REFLEX TO FT4: 1.16 m[IU]/L

## 2025-01-06 ENCOUNTER — Other Ambulatory Visit

## 2025-01-13 ENCOUNTER — Ambulatory Visit: Admitting: "Endocrinology
